# Patient Record
Sex: Male | Born: 1945 | Race: White | Hispanic: No | Marital: Married | Smoking: Former smoker
Health system: Southern US, Community
[De-identification: ages and names within clinical notes are randomized; demographics above are authoritative.]

## PROBLEM LIST (undated history)

## (undated) DIAGNOSIS — E785 Hyperlipidemia, unspecified: Secondary | ICD-10-CM

## (undated) DIAGNOSIS — H269 Unspecified cataract: Secondary | ICD-10-CM

## (undated) DIAGNOSIS — I10 Essential (primary) hypertension: Secondary | ICD-10-CM

## (undated) DIAGNOSIS — M1990 Unspecified osteoarthritis, unspecified site: Secondary | ICD-10-CM

## (undated) DIAGNOSIS — M109 Gout, unspecified: Secondary | ICD-10-CM

## (undated) DIAGNOSIS — T7840XA Allergy, unspecified, initial encounter: Secondary | ICD-10-CM

## (undated) HISTORY — PX: CATARACT EXTRACTION: SUR2

## (undated) HISTORY — PX: POLYPECTOMY: SHX149

## (undated) HISTORY — DX: Unspecified osteoarthritis, unspecified site: M19.90

## (undated) HISTORY — DX: Allergy, unspecified, initial encounter: T78.40XA

## (undated) HISTORY — DX: Gout, unspecified: M10.9

## (undated) HISTORY — DX: Unspecified cataract: H26.9

## (undated) HISTORY — PX: ROTATOR CUFF REPAIR: SHX139

## (undated) HISTORY — DX: Essential (primary) hypertension: I10

## (undated) HISTORY — PX: COLONOSCOPY: SHX174

## (undated) HISTORY — DX: Hyperlipidemia, unspecified: E78.5

---

## 1984-03-17 HISTORY — PX: APPENDECTOMY: SHX54

## 2001-01-20 DIAGNOSIS — K648 Other hemorrhoids: Secondary | ICD-10-CM | POA: Insufficient documentation

## 2001-01-20 DIAGNOSIS — K5730 Diverticulosis of large intestine without perforation or abscess without bleeding: Secondary | ICD-10-CM | POA: Insufficient documentation

## 2001-01-20 DIAGNOSIS — D126 Benign neoplasm of colon, unspecified: Secondary | ICD-10-CM | POA: Insufficient documentation

## 2002-04-05 ENCOUNTER — Ambulatory Visit (HOSPITAL_COMMUNITY): Admission: RE | Admit: 2002-04-05 | Discharge: 2002-04-05 | Payer: Self-pay | Admitting: Internal Medicine

## 2004-02-06 ENCOUNTER — Ambulatory Visit: Payer: Self-pay | Admitting: Internal Medicine

## 2004-02-27 ENCOUNTER — Ambulatory Visit: Payer: Self-pay | Admitting: Internal Medicine

## 2004-06-14 ENCOUNTER — Encounter: Admission: RE | Admit: 2004-06-14 | Discharge: 2004-06-14 | Payer: Self-pay | Admitting: Internal Medicine

## 2007-02-22 ENCOUNTER — Ambulatory Visit: Payer: Self-pay | Admitting: Internal Medicine

## 2007-02-23 ENCOUNTER — Ambulatory Visit: Payer: Self-pay | Admitting: Internal Medicine

## 2007-03-17 DIAGNOSIS — I10 Essential (primary) hypertension: Secondary | ICD-10-CM | POA: Insufficient documentation

## 2007-03-17 DIAGNOSIS — K921 Melena: Secondary | ICD-10-CM | POA: Insufficient documentation

## 2010-07-30 NOTE — Assessment & Plan Note (Signed)
[facility]                         GASTROENTEROLOGY OFFICE NOTE   BULMARO, FEAGANS                         MRN:          [id number]  DATE:02/22/2007                            DOB:          [birth year]    REASON FOR CONSULTATION:  Hemoccult positive stool and change in bowel  habits.   HISTORY:  This is a 65 year old white male with a history of adenomatous  colon polyps.  He is referred through the courtesy of Dr. Waynard Edwards  regarding Hemoccult positive stool and change in bowel habits.  Patient  reports undergoing his routine annual evaluation with Dr. Waynard Edwards last  month.  At that time his stool Hemosure was positive.  The month  previous he noted change in caliber of his stools to more thin as well  as loose stools.  Subsequently, some constipation and intermittent  abdominal pain.  He is happy to report that over the past few days his  symptoms have resolved and bowel habits are back to normal.  No gross  bleeding at any time.  His weight has been stable.  Hemoglobin was  normal.  His initial colonoscopy in 2002 revealed adenomatous colon  polyps.  His last examination was in December of 2005.  He was found to  have diverticulosis.  No polyps.  Followup in 5 years recommended.  Patient does have strong family history of colon cancer.  His mother was  diagnosed with colon cancer at age 46, also two maternal uncles.  He is  quite concerned over his recent symptoms and Hemoccult positive stool.   PAST MEDICAL HISTORY:  Hypertension.   PAST SURGICAL HISTORY:  Appendectomy.   ALLERGIES:  NO KNOWN DRUG ALLERGIES.   CURRENT MEDICATIONS:  1. Avalide 150/12.5 mg daily.  2. Lexapro 5 mg daily.  3. Aspirin 81 mg daily.  4. Pravastatin 20 mg daily.  5. Fish oil.  6. Saw palmetto.  7. Vitamin E.  8. Multivitamin.  9. Colchicine p.r.n. gout.   FAMILY HISTORY:  Mother with colon cancer at age 60.  Two maternal  uncles with colon cancer as well.   Father with heart disease.   SOCIAL HISTORY:  Patient is married with 2 sons, he lives with his wife.  He has a BS degree and worked previously in [employer],  although is currently not working.  He does not smoke, he does use  alcohol.   REVIEW OF SYSTEMS:  Per diagnostic evaluation form.   PHYSICAL EXAMINATION:  Well-appearing male in no acute distress.  Blood  pressure is 148/84, heart rate is 80, weight is 220 pounds, he is 5 feet  8 inches in height.  HEENT:  Sclerae are anicteric, conjunctivae are pink, oral mucosa is  intact, no adenopathy.  LUNGS:  Clear.  HEART:  Regular.  ABDOMEN:  Soft without tenderness, mass or hernia.  Good bowel sounds  heard.   IMPRESSION:  1. Transient changes in bowel habits and stool caliber, now resolved.      Clinical significance uncertain though doubt anything worrisome.  2. Hemoccult positive stool without anemia.  3.  History of adenomatous colon polyps.  4. Strong family history of colon cancer.   RECOMMENDATIONS:  Colonoscopy to evaluate Hemoccult positive stool and  change in bowel habits.  Ashby Dawes of the procedure as well as the risks,  benefits and alternatives have been reviewed.  He understood and agreed  to proceed.     Wilhemina Bonito. Marina Goodell, MD  Electronically Signed    JNP/MedQ  DD: 02/22/2007  DT: 02/22/2007  Job #: [id number]   cc:   Loraine Leriche A. Perini, M.D.

## 2012-01-26 ENCOUNTER — Encounter: Payer: Self-pay | Admitting: Internal Medicine

## 2012-06-14 ENCOUNTER — Encounter: Payer: Self-pay | Admitting: Internal Medicine

## 2012-06-24 ENCOUNTER — Encounter: Payer: Self-pay | Admitting: Internal Medicine

## 2012-07-21 ENCOUNTER — Ambulatory Visit (AMBULATORY_SURGERY_CENTER): Payer: Managed Care, Other (non HMO) | Admitting: *Deleted

## 2012-07-21 VITALS — Ht 70.0 in | Wt 217.8 lb

## 2012-07-21 DIAGNOSIS — Z1211 Encounter for screening for malignant neoplasm of colon: Secondary | ICD-10-CM

## 2012-07-21 MED ORDER — MOVIPREP 100 G PO SOLR: ORAL | Status: DC

## 2012-07-22 ENCOUNTER — Encounter: Payer: Self-pay | Admitting: Internal Medicine

## 2012-08-04 ENCOUNTER — Encounter: Payer: Managed Care, Other (non HMO) | Admitting: Internal Medicine

## 2012-09-23 ENCOUNTER — Encounter: Payer: Self-pay | Admitting: Internal Medicine

## 2012-09-23 ENCOUNTER — Ambulatory Visit (AMBULATORY_SURGERY_CENTER): Payer: Medicare Other | Admitting: Internal Medicine

## 2012-09-23 VITALS — BP 147/81 | HR 59 | Temp 97.1°F | Resp 20 | Ht 70.0 in | Wt 217.0 lb

## 2012-09-23 DIAGNOSIS — D126 Benign neoplasm of colon, unspecified: Secondary | ICD-10-CM

## 2012-09-23 DIAGNOSIS — Z800 Family history of malignant neoplasm of digestive organs: Secondary | ICD-10-CM

## 2012-09-23 DIAGNOSIS — K5730 Diverticulosis of large intestine without perforation or abscess without bleeding: Secondary | ICD-10-CM

## 2012-09-23 DIAGNOSIS — Z86010 Personal history of colonic polyps: Secondary | ICD-10-CM

## 2012-09-23 MED ORDER — SODIUM CHLORIDE 0.9 % IV SOLN: 500.0000 mL | INTRAVENOUS | Status: DC

## 2012-09-23 NOTE — Op Note (Signed)
[facility] [address] [place], [address]   COLONOSCOPY PROCEDURE REPORT  PATIENT: Randy, Moreno  MR#: [id number] BIRTHDATE: 31-Jan-1946 , 66  yrs. old GENDER: Male ENDOSCOPIST: Roxy Cedar, MD REFERRED XB:[id number] Program Recall PROCEDURE DATE:  09/23/2012 PROCEDURE:   Colonoscopy with snare polypectomy x 1 First Screening Colonoscopy - Avg.  risk and is 50 yrs.  old or older - No.  Prior Negative Screening - Now for repeat screening. N/A  History of Adenoma - Now for follow-up colonoscopy & has been > or = to 3 yrs.  Yes hx of adenoma.  Has been 3 or more years since last colonoscopy.  Polyps Removed Today? Yes. ASA CLASS:   Class II INDICATIONS:Patient's personal history of adenomatous colon polyps (2002 TA; f/u 2005 and 2008 were negative) and Patient's immediate family history of colon cancer (Parent 42; and 2 maternal uncles).  MEDICATIONS: MAC sedation, administered by CRNA and propofol (Diprivan) 400mg  IV  DESCRIPTION OF PROCEDURE:   After the risks benefits and alternatives of the procedure were thoroughly explained, informed consent was obtained.  A digital rectal exam revealed no abnormalities of the rectum.   The LB [id number]-[id number]  endoscope was introduced through the anus and advanced to the cecum, which was identified by both the appendix and ileocecal valve. No adverse events experienced.   The quality of the prep was good, using MoviPrep  The instrument was then slowly withdrawn as the colon was fully examined.   COLON FINDINGS: A diminutive polyp was found in the sigmoid colon. A polypectomy was performed with a cold snare.  The resection was complete and the polyp tissue was completely retrieved.   Moderate diverticulosis was noted The finding was in the left colon.   The colon mucosa was otherwise normal.  Retroflexed views revealed internal hemorrhoids. The time to cecum=3 minutes 05 seconds. Withdrawal time=12 minutes 23  seconds.  The scope was withdrawn and the procedure completed. COMPLICATIONS: There were no complications.  ENDOSCOPIC IMPRESSION: 1.   Diminutive polyp was found in the sigmoid colon; polypectomy was performed with a cold snare 2.   Moderate diverticulosis was noted in the left colon 3.   The colon mucosa was otherwise normal  RECOMMENDATIONS: 1. Follow up colonoscopy in 5 years   eSigned:  Roxy Cedar, MD 09/23/2012 12:26 PM   cc: Rodrigo Ran, MD and The Patient   PATIENT NAME:  Randy, Moreno MR#: [id number]

## 2012-09-23 NOTE — Progress Notes (Signed)
Procedure ends, to recovery, report given and VSS. 

## 2012-09-23 NOTE — Progress Notes (Signed)
Patient did not experience any of the following events: a burn prior to discharge; a fall within the facility; wrong site/side/patient/procedure/implant event; or a hospital transfer or hospital admission upon discharge from the facility. (G8907) Patient did not have preoperative order for IV antibiotic SSI prophylaxis. (G8918)  

## 2012-09-23 NOTE — Patient Instructions (Addendum)

## 2012-09-23 NOTE — Progress Notes (Signed)
Patient treated for bee sting Monday, 09/20/12. Right hand still edematous. Patient given prednisone injection. Poison oak rash left lower leg near ankle.

## 2012-09-23 NOTE — Progress Notes (Signed)
Called to room to assist during endoscopic procedure.  Patient ID and intended procedure confirmed with present staff. Received instructions for my participation in the procedure from the performing physician. ewm 

## 2012-09-24 ENCOUNTER — Telehealth: Payer: Self-pay | Admitting: *Deleted

## 2012-09-24 NOTE — Telephone Encounter (Signed)
  Follow up Call-  Call back number 09/23/2012  Post procedure Call Back phone  # [phone]  Permission to leave phone message Yes     Patient questions:  Do you have a fever, pain , or abdominal swelling? no Pain Score  0 *  Have you tolerated food without any problems? yes  Have you been able to return to your normal activities? yes  Do you have any questions about your discharge instructions: Diet   no Medications  no Follow up visit  no  Do you have questions or concerns about your Care? no  Actions: * If pain score is 4 or above: No action needed, pain <4.

## 2012-09-28 ENCOUNTER — Encounter: Payer: Self-pay | Admitting: Internal Medicine

## 2013-01-17 ENCOUNTER — Ambulatory Visit (INDEPENDENT_AMBULATORY_CARE_PROVIDER_SITE_OTHER): Payer: Medicare Other | Admitting: Neurology

## 2013-01-17 ENCOUNTER — Encounter: Payer: Self-pay | Admitting: Neurology

## 2013-01-17 VITALS — BP 159/80 | HR 75 | Ht 68.5 in | Wt 214.5 lb

## 2013-01-17 DIAGNOSIS — G629 Polyneuropathy, unspecified: Secondary | ICD-10-CM | POA: Insufficient documentation

## 2013-01-17 DIAGNOSIS — G609 Hereditary and idiopathic neuropathy, unspecified: Secondary | ICD-10-CM

## 2013-01-17 MED ORDER — GABAPENTIN ENACARBIL ER 600 MG PO TBCR: 600.0000 mg | EXTENDED_RELEASE_TABLET | Freq: Every day | ORAL | Status: DC

## 2013-01-17 NOTE — Progress Notes (Signed)
[facility]    Provider:  Dr Hosie Poisson Referring Provider: Ezequiel Kayser, MD Primary Care Physician:  Ezequiel Kayser, MD  CC:  Neuropathic pain  HPI:  Randy Moreno is a 67 y.o. male here as a referral from Dr. Waynard Edwards for neuropathic pain  Started in April, started in soles of feet and worked its way up to the knees. Burning, tingling pain, continous, can fluctuate, worse with sitting down. Doesn't keep him awake at night. No urge to move, no restless sensation. Has some unsteady sensation when walking, no difficulty with proprioception or temperature. No UE symptoms. No DM. No family history of neurological problems. Tried Lyrica 50mg  1-2 times per day, did little to no good. Tried gabapentin, made him very groggy, unsure if it made him feel any better. Denies any DM. Had extensive lab workup with Dr Waynard Edwards, was unremarkable.   Reviewed notes, labs and imaging from outside physicians, which showed unremarkable lab workup for causes of peripheral neuropathy.  Review of Systems: Out of a complete 14 system review, the patient complains of only the following symptoms, and all other reviewed systems are negative. Positive for paresthesias and pain in lower extremities  History   Social History  . Marital Status: Married    Spouse Name: Alice     Number of Children: 2  . Years of Education: BS   Occupational History  . Retired    Social History Main Topics  . Smoking status: Former Smoker    Quit date: 07/22/1987  . Smokeless tobacco: Never Used  . Alcohol Use: 16.8 oz/week    28 Glasses of wine per week     Comment: daily  . Drug Use: No  . Sexual Activity: Not on file   Other Topics Concern  . Not on file   Social History Narrative   Patient lives with wife [place].    patient has 2 children.    Patient has his BS   Patient drinks 2-4 glasses wife dialy.    Drinks caffeine daily.           Family History  Problem Relation Age of Onset  . Colon  cancer Mother 6  . Heart disease Father   . Diabetes Father   . Heart Problems Paternal Grandfather     Past Medical History  Diagnosis Date  . Hypertension   . Gout   . Hyperlipidemia   . Allergy     Past Surgical History  Procedure Laterality Date  . Appendectomy  1986  . Cataract extraction      Current Outpatient Prescriptions  Medication Sig Dispense Refill  . amLODipine (NORVASC) 5 MG tablet Take 5 mg by mouth daily.      Marland Kitchen aspirin 81 MG tablet Take 81 mg by mouth daily.      . cholecalciferol (VITAMIN D) 1000 UNITS tablet Take 1,000 Units by mouth daily.      . colchicine 0.6 MG tablet Take 0.6 mg by mouth 2 (two) times daily.      . Cyanocobalamin (VITAMIN B 12 PO) Take by mouth daily.      Marland Kitchen escitalopram (LEXAPRO) 10 MG tablet Take 10 mg by mouth daily.      . fish oil-omega-3 fatty acids 1000 MG capsule Take 2 g by mouth daily.      Marland Kitchen losartan-hydrochlorothiazide (HYZAAR) 100-12.5 MG per tablet Take 1 tablet by mouth daily.      . Multiple Vitamin (MULTIVITAMIN) tablet Take 1 tablet by mouth  daily.      . Testosterone (ANDROGEL) 40.5 MG/2.5GM (1.62%) GEL Place onto the skin daily.      Marland Kitchen atorvastatin (LIPITOR) 20 MG tablet Take 20 mg by mouth daily.       No current facility-administered medications for this visit.    Allergies as of 01/17/2013  . (No Known Allergies)    Vitals: BP 159/80  Pulse 75  Ht 5' 8.5" (1.74 m)  Wt 214 lb 8 oz (97.297 kg)  BMI 32.14 kg/m2 Last Weight:  Wt Readings from Last 1 Encounters:  01/17/13 214 lb 8 oz (97.297 kg)   Last Height:   Ht Readings from Last 1 Encounters:  01/17/13 5' 8.5" (1.74 m)     Physical exam: Exam: Gen: NAD, conversant Eyes: anicteric sclerae, moist conjunctivae HENT: Atraumatic, oropharynx clear Neck: Trachea midline; supple,  Lungs: CTA, no wheezing, rales, rhonic                          CV: RRR, no MRG Abdomen: Soft, non-tender;  Extremities: No peripheral edema  Skin: Normal  temperature, no rash,  Psych: Appropriate affect, pleasant  Neuro: MS: AA&Ox3, appropriately interactive, normal affect   Speech: fluent w/o paraphasic error  Memory: good recent and remote recall  CN: PERRL, EOMI no nystagmus, no ptosis, sensation intact to LT V1-V3 bilat, face symmetric, no weakness, hearing grossly intact, palate elevates symmetrically, shoulder shrug 5/5 bilat,  tongue protrudes midline, no fasiculations noted.  Motor: normal bulk and tone Strength: 5/5  In all extremities  Coord: rapid alternating and point-to-point (FNF, HTS) movements intact.  Reflexes: symmetrical, bilat downgoing toes  Sens: LT intact in all extremities, diminished vibration and PP to mid-shin, intact proprioception  Gait: posture, stance, stride and arm-swing normal. Tandem gait intact. Able to walk on heels and toes. Romberg absent.   Assessment:  After physical and neurologic examination, review of laboratory studies, imaging, neurophysiology testing and pre-existing records, assessment will be reviewed on the problem list.  Plan:  Treatment plan and additional workup will be reviewed under Problem List.  1)Idiopathic peripheral neuropathy  67y/o gentleman with history and physical exam consistent with an idiopathic peripheral neuropathy. Has tried Lyrica and gabapentin without symptomatic relief. Will check EMG nerve conduction study. Will try patient on Horizant 600mg  nightly. Patient provided with 1 months free samples to trial medication. If no symptomatic benefit would consider SNRI or alternative AED. Follow up in 4 to 6 months.

## 2013-01-17 NOTE — Patient Instructions (Signed)
Overall you are doing fairly well but I do want to suggest a few things today:   As far as your medications are concerned, I would like to suggest trying a medication called Horizant. It is a different form of Gabapentin that you may tolerate better. Try it for 4 weeks and see if you notice any benefit.   As far as diagnostic testing: I would like to get an EMG/NCS.   I would like to see you back in 4 to 6 months, sooner if we need to. Please call us with any interim questions, concerns, problems, updates or refill requests.   Please also call us for any test results so we can go over those with you on the phone.  My clinical assistant and will answer any of your questions and relay your messages to me and also relay most of my messages to you.   Our phone number is [phone]. We also have an after hours call service for urgent matters and there is a physician on-call for urgent questions. For any emergencies you know to call 911 or go to the nearest emergency room

## 2013-01-21 ENCOUNTER — Encounter (INDEPENDENT_AMBULATORY_CARE_PROVIDER_SITE_OTHER): Payer: Self-pay

## 2013-01-21 ENCOUNTER — Ambulatory Visit (INDEPENDENT_AMBULATORY_CARE_PROVIDER_SITE_OTHER): Payer: Medicare Other | Admitting: Neurology

## 2013-01-21 DIAGNOSIS — G609 Hereditary and idiopathic neuropathy, unspecified: Secondary | ICD-10-CM

## 2013-01-21 DIAGNOSIS — Z0289 Encounter for other administrative examinations: Secondary | ICD-10-CM

## 2013-01-21 DIAGNOSIS — R209 Unspecified disturbances of skin sensation: Secondary | ICD-10-CM

## 2013-01-21 NOTE — Procedures (Signed)
  HISTORY:  Randy Moreno is a 67 year old gentleman with a history of numbness and tingling sensations in the feet and legs that began in April 2014. This gradually worsened over the next several months, but more recently, the symptoms have improved slightly. The patient denies any neck or low back discomfort or pain radiating down the arms or legs. The patient is being evaluated for a possible peripheral neuropathy.  NERVE CONDUCTION STUDIES:  Nerve conduction studies were performed on both lower extremities. The distal motor latencies and motor amplitudes for the peroneal and posterior tibial nerves were within normal limits. The nerve conduction velocities for these nerves were also normal. The H reflex latencies were normal. The sensory latencies for the peroneal nerves were within normal limits.   EMG STUDIES:  EMG study was performed on the right lower extremity:  The tibialis anterior muscle reveals 2 to 4K motor units with full recruitment. No fibrillations or positive waves were seen. The peroneus tertius muscle reveals 2 to 4K motor units with full recruitment. No fibrillations or positive waves were seen. The medial gastrocnemius muscle reveals 1 to 3K motor units with full recruitment. No fibrillations or positive waves were seen. The vastus lateralis muscle reveals 2 to 4K motor units with full recruitment. No fibrillations or positive waves were seen. The iliopsoas muscle reveals 2 to 4K motor units with full recruitment. No fibrillations or positive waves were seen. The biceps femoris muscle (long head) reveals 2 to 4K motor units with full recruitment. No fibrillations or positive waves were seen. The lumbosacral paraspinal muscles were tested at 3 levels, and revealed no abnormalities of insertional activity at all 3 levels tested. There was good relaxation.  EMG study was performed on the left lower extremity:  The tibialis anterior muscle reveals 2 to 4K motor units with full  recruitment. No fibrillations or positive waves were seen. The peroneus tertius muscle reveals 2 to 4K motor units with full recruitment. No fibrillations or positive waves were seen. The medial gastrocnemius muscle reveals 1 to 3K motor units with full recruitment. No fibrillations or positive waves were seen. The vastus lateralis muscle reveals 2 to 4K motor units with full recruitment. No fibrillations or positive waves were seen. The iliopsoas muscle reveals 2 to 4K motor units with full recruitment. No fibrillations or positive waves were seen. The biceps femoris muscle (long head) reveals 2 to 4K motor units with full recruitment. No fibrillations or positive waves were seen. The lumbosacral paraspinal muscles were tested at 3 levels, and revealed no abnormalities of insertional activity at all 3 levels tested. There was good relaxation.   IMPRESSION:  Nerve conduction studies done on both lower extremities were unremarkable, without evidence of a peripheral neuropathy. A small fiber neuropathy may be missed by standard nerve conduction studies, however. Clinical correlation is required. EMG evaluation of both lower extremities was unremarkable, no evidence of a lumbosacral radiculopathy was seen on either side.  Marlan Palau MD 01/21/2013 4:46 PM  [facility] [address] [place], [place] [address]  Phone [phone] Fax [phone]

## 2013-01-24 NOTE — Progress Notes (Signed)
Quick Note:  Left message that EMG/NCS was normal and to continue on trial of Horizant, per Dr. Hosie Poisson. Told to call with any questions. ______

## 2015-01-22 ENCOUNTER — Emergency Department (HOSPITAL_COMMUNITY)
Admission: EM | Admit: 2015-01-22 | Discharge: 2015-01-22 | Disposition: A | Discharge status: Home / Self Care | Payer: Medicare Other | Attending: Emergency Medicine | Admitting: Emergency Medicine

## 2015-01-22 ENCOUNTER — Encounter (HOSPITAL_COMMUNITY): Payer: Self-pay | Admitting: Emergency Medicine

## 2015-01-22 ENCOUNTER — Emergency Department (HOSPITAL_COMMUNITY): Payer: Medicare Other

## 2015-01-22 DIAGNOSIS — E785 Hyperlipidemia, unspecified: Secondary | ICD-10-CM | POA: Diagnosis not present

## 2015-01-22 DIAGNOSIS — T1490XA Injury, unspecified, initial encounter: Secondary | ICD-10-CM

## 2015-01-22 DIAGNOSIS — Y9389 Activity, other specified: Secondary | ICD-10-CM | POA: Diagnosis not present

## 2015-01-22 DIAGNOSIS — W540XXA Bitten by dog, initial encounter: Secondary | ICD-10-CM | POA: Insufficient documentation

## 2015-01-22 DIAGNOSIS — Z23 Encounter for immunization: Secondary | ICD-10-CM | POA: Insufficient documentation

## 2015-01-22 DIAGNOSIS — S61216A Laceration without foreign body of right little finger without damage to nail, initial encounter: Secondary | ICD-10-CM | POA: Insufficient documentation

## 2015-01-22 DIAGNOSIS — Y9289 Other specified places as the place of occurrence of the external cause: Secondary | ICD-10-CM | POA: Insufficient documentation

## 2015-01-22 DIAGNOSIS — S61452A Open bite of left hand, initial encounter: Secondary | ICD-10-CM | POA: Diagnosis present

## 2015-01-22 DIAGNOSIS — Z87891 Personal history of nicotine dependence: Secondary | ICD-10-CM | POA: Diagnosis not present

## 2015-01-22 DIAGNOSIS — Z79899 Other long term (current) drug therapy: Secondary | ICD-10-CM | POA: Diagnosis not present

## 2015-01-22 DIAGNOSIS — Y998 Other external cause status: Secondary | ICD-10-CM | POA: Diagnosis not present

## 2015-01-22 DIAGNOSIS — Z7982 Long term (current) use of aspirin: Secondary | ICD-10-CM | POA: Diagnosis not present

## 2015-01-22 DIAGNOSIS — S61256A Open bite of right little finger without damage to nail, initial encounter: Secondary | ICD-10-CM | POA: Diagnosis not present

## 2015-01-22 DIAGNOSIS — I10 Essential (primary) hypertension: Secondary | ICD-10-CM | POA: Insufficient documentation

## 2015-01-22 DIAGNOSIS — M109 Gout, unspecified: Secondary | ICD-10-CM | POA: Insufficient documentation

## 2015-01-22 DIAGNOSIS — S61259A Open bite of unspecified finger without damage to nail, initial encounter: Secondary | ICD-10-CM

## 2015-01-22 MED ORDER — AMOXICILLIN-POT CLAVULANATE 875-125 MG PO TABS: 1.0000 | ORAL_TABLET | Freq: Two times a day (BID) | ORAL | Status: DC

## 2015-01-22 MED ORDER — TETANUS-DIPHTH-ACELL PERTUSSIS 5-2.5-18.5 LF-MCG/0.5 IM SUSP
0.5000 mL | Freq: Once | INTRAMUSCULAR | Status: AC
  Administered 2015-01-22: 0.5 mL via INTRAMUSCULAR
  Filled 2015-01-22: qty 0.5

## 2015-01-22 MED ORDER — HYDROCODONE-ACETAMINOPHEN 5-325 MG PO TABS: 2.0000 | ORAL_TABLET | ORAL | Status: DC | PRN

## 2015-01-22 NOTE — Discharge Instructions (Signed)
Animal Bite Animal bites can range from mild to serious. An animal bite can result in a scratch on the skin, a deep open cut, a puncture of the skin, a crush injury, or tearing away of the skin or a body part. A small bite from a house pet will usually not cause serious problems. However, some animal bites can become infected or injure a bone or other tissue.  Bites from certain animals can be more dangerous because of the risk of spreading rabies, which is a serious viral infection. This risk is higher with bites from stray animals or wild animals, such as raccoons, foxes, skunks, and bats. Dogs are responsible for most animal bites. Children are bitten more often than adults. SYMPTOMS  Common symptoms of an animal bite include:   Pain.   Bleeding.   Swelling.   Bruising.  DIAGNOSIS  This condition may be diagnosed based on a physical exam and medical history. Your health care provider will examine the wound and ask for details about the animal and how the bite happened. You may also have tests, such as:   Blood tests to check for infection or to determine if surgery is needed.  X-rays to check for damage to bones or joints.  Culture test. This uses a sample of fluid from the wound to check for infection. TREATMENT  Treatment varies depending on the location and type of animal bite and your medical history. Treatment may include:   Wound care. This often includes cleaning the wound, flushing the wound with saline solution, and applying a bandage (dressing). Sometimes, the wound is left open to heal because of the high risk of infection. However, in some cases, the wound may be closed with stitches (sutures), staples, skin glue, or adhesive strips.   Antibiotic medicine.   Tetanus shot.   Rabies treatment if the animal could have rabies.  In some cases, bites that have become infected may require IV antibiotics and surgical treatment in the hospital.  [place]  Follow instructions from your health care provider about how to take care of your wound. Make sure you:  Wash your hands with soap and water before you change your dressing. If soap and water are not available, use hand sanitizer.  Change your dressing as told by your health care provider.  Leave sutures, skin glue, or adhesive strips in place. These skin closures may need to be in place for 2 weeks or longer. If adhesive strip edges start to loosen and curl up, you may trim the loose edges. Do not remove adhesive strips completely unless your health care provider tells you to do that.  Check your wound every day for signs of infection. Watch for:   Increasing redness, swelling, or pain.   Fluid, blood, or pus.  General Instructions  Take or apply over-the-counter and prescription medicines only as told by your health care provider.   If you were prescribed an antibiotic, take or apply it as told by your health care provider. Do not stop using the antibiotic even if your condition improves.   Keep the injured area raised (elevated) above the level of your heart while you are sitting or lying down, if this is possible.   If directed, apply ice to the injured area.   Put ice in a plastic bag.   Place a towel between your skin and the bag.   Leave the ice on for 20 minutes, 2-3 times per day.  Keep all follow-up visits as told by your health care provider. This is important.  SEEK MEDICAL CARE IF:  You have increasing redness, swelling, or pain at the site of your wound.   You have a general feeling of sickness (malaise).   You feel nauseous or you vomit.   You have pain that does not get better.  SEEK IMMEDIATE MEDICAL CARE IF:  You have a red streak extending away from your wound.   You have fluid, blood, or pus coming from your wound.   You have a fever or chills.   You have trouble moving your injured area.   You  have numbness or tingling extending beyond the wound.   This information is not intended to replace advice given to you by your health care provider. Make sure you discuss any questions you have with your health care provider.   Document Released: 11/19/2010 Document Revised: 11/22/2014 Document Reviewed: 07/19/2014 Elsevier Interactive Patient Education 2016 [place] Taking care of your wound properly can help to prevent pain and infection. It can also help your wound to heal more quickly.  HOW TO CARE FOR YOUR WOUND  Take or apply over-the-counter and prescription medicines only as told by your health care provider.  If you were prescribed antibiotic medicine, take or apply it as told by your health care provider. Do not stop using the antibiotic even if your condition improves.  Clean the wound each day or as told by your health care provider.  Wash the wound with mild soap and water.  Rinse the wound with water to remove all soap.  Pat the wound dry with a clean towel. Do not rub it.  There are many different ways to close and cover a wound. For example, a wound can be covered with stitches (sutures), skin glue, or adhesive strips. Follow instructions from your health care provider about:  How to take care of your wound.  When and how you should change your bandage (dressing).  When you should remove your dressing.  Removing whatever was used to close your wound.  Check your wound every day for signs of infection. Watch for:  Redness, swelling, or pain.  Fluid, blood, or pus.  Keep the dressing dry until your health care provider says it can be removed. Do not take baths, swim, use a hot tub, or do anything that would put your wound underwater until your health care provider approves.  Raise (elevate) the injured area above the level of your heart while you are sitting or lying down.  Do not scratch or pick at the wound.  Keep all follow-up visits  as told by your health care provider. This is important. SEEK MEDICAL CARE IF:  You received a tetanus shot and you have swelling, severe pain, redness, or bleeding at the injection site.  You have a fever.  Your pain is not controlled with medicine.  You have increased redness, swelling, or pain at the site of your wound.  You have fluid, blood, or pus coming from your wound.  You notice a bad smell coming from your wound or your dressing. SEEK IMMEDIATE MEDICAL CARE IF:  You have a red streak going away from your wound.   This information is not intended to replace advice given to you by your health care provider. Make sure you discuss any questions you have with your health care provider.   Document Released: 12/11/2007 Document Revised: 07/18/2014 Document Reviewed: 02/27/2014 Elsevier Interactive  Patient Education 2016 [place].  Mechanical Wound Debridement, Care After Refer to this sheet in the next few weeks. These instructions provide you with information about caring for yourself after your procedure. Your health care provider may also give you more specific instructions. Your treatment has been planned according to current medical practices, but problems sometimes occur. Call your health care provider if you have any problems or questions after your procedure.  WHAT TO EXPECT AFTER THE PROCEDURE: After your procedure, it is common to have:  Soreness or pain.  Light bleeding.  Tightness.  Skin irritation. HOME CARE INSTRUCTIONS Medicines  Take over-the-counter and prescription medicines only as told by your health care provider.  If you were prescribed an antibiotic medicine, take it or apply it as told by your health care provider. Do not stop taking or using the antibiotic even if your condition improves. Wound Care  Follow instructions from your health care provider about:  How to take care of your wound.  When and how you should change your dressing.  If your dressing is dry and stuck when you try to remove it, moisten or wet the dressing with saline or water so that it can be removed without harming your skin or wound.  When you should remove your dressing.  Check your wound every day for signs of infection. Watch for:  More redness or swelling.  More fluid or pus.  A bad smell.  More pain, bleeding, or warmth around the wound. General Instructions  Eat a healthy diet with lots of protein. Ask your health care provider to suggest the best diet for you.  Do not smoke. Smoking makes it harder for your body to heal.  Keep all follow-up visits as told by your health care provider. This is important.  Do not take baths, swim, or use a hot tub until your health care provider approves.   Ask your health care provider what activities are safe for you.  SEEK MEDICAL CARE IF:  You have a fever.  Your pain medicine is not helping.   Your wound is red and swollen.   You have more bleeding or fluid coming from the wound.  You have pus coming from your wound after cleaning it.  You have a bad smell coming from your wound after cleaning it.  Your wound is not getting better within 1-2 weeks of treatment.  You develop a new medical condition, such as diabetes, peripheral vascular disease, or a condition that affects your defense (immune) system.   This information is not intended to replace advice given to you by your health care provider. Make sure you discuss any questions you have with your health care provider.   Follow up with your Primary Care in 24-48 hours for re-evaluation of wound. Return to the [facility] if you experience redness, swelling, or excessive drainage from affected area or if you experience a fever, chills, muscle rigidity. Take antibiotics as prescribed.

## 2015-01-22 NOTE — ED Notes (Signed)
Patient was educated not to drive, operate heavy machinery, or drink alcohol while taking narcotic medication.  

## 2015-01-22 NOTE — ED Notes (Signed)
Per pt, states playing with his puppy and he accidentally bit him-laceration to right little finger

## 2015-01-22 NOTE — ED Provider Notes (Signed)
CSN: [id number]     Arrival date & time 01/22/15  1016 History   First MD Initiated Contact with Patient 01/22/15 1026     Chief Complaint  Patient presents with  . Animal Bite     (Consider location/radiation/quality/duration/timing/severity/associated sxs/prior Treatment) HPI   Randy Moreno is a 69 year old male who presents the emergency department complaining of dog bite to left hand. Patient states that he was playing with his 77-month-old Restaurant manager, fast food puppy last night when he accidentally bit his right pinky. This occurred 14 hours prior to arrival. The dog is known to the patient and has had his rabies vaccinations. Denies redness or swelling of his finger, drainage from the affected area. Denies fever, muscle rigidity. Patient is able to flex and extend his pinky without difficulty. No other trauma or injury. Pt is unsure of his last tetanus shot.  Past Medical History  Diagnosis Date  . Hypertension   . Gout   . Hyperlipidemia   . Allergy    Past Surgical History  Procedure Laterality Date  . Appendectomy  1986  . Cataract extraction     Family History  Problem Relation Age of Onset  . Colon cancer Mother 13  . Heart disease Father   . Diabetes Father   . Heart Problems Paternal Grandfather    Social History  Substance Use Topics  . Smoking status: Former Smoker    Quit date: 07/22/1987  . Smokeless tobacco: Never Used  . Alcohol Use: 16.8 oz/week    28 Glasses of wine per week     Comment: daily    Review of Systems  All other systems reviewed and are negative.     Allergies  Review of patient's allergies indicates no known allergies.  Home Medications   Prior to Admission medications   Medication Sig Start Date End Date Taking? Authorizing Provider  amLODipine (NORVASC) 5 MG tablet Take 5 mg by mouth daily.    Historical Provider, MD  aspirin 81 MG tablet Take 81 mg by mouth daily.    Historical Provider, MD  atorvastatin (LIPITOR) 20 MG tablet  Take 20 mg by mouth daily.    Historical Provider, MD  cholecalciferol (VITAMIN D) 1000 UNITS tablet Take 1,000 Units by mouth daily.    Historical Provider, MD  colchicine 0.6 MG tablet Take 0.6 mg by mouth 2 (two) times daily.    Historical Provider, MD  Cyanocobalamin (VITAMIN B 12 PO) Take by mouth daily.    Historical Provider, MD  escitalopram (LEXAPRO) 10 MG tablet Take 10 mg by mouth daily.    Historical Provider, MD  fish oil-omega-3 fatty acids 1000 MG capsule Take 2 g by mouth daily.    Historical Provider, MD  Gabapentin Enacarbil (HORIZANT) 600 MG TBCR Take 600 mg by mouth at bedtime. 01/17/13   Drema Dallas, DO  losartan-hydrochlorothiazide (HYZAAR) 100-12.5 MG per tablet Take 1 tablet by mouth daily.    Historical Provider, MD  Multiple Vitamin (MULTIVITAMIN) tablet Take 1 tablet by mouth daily.    Historical Provider, MD  Testosterone (ANDROGEL) 40.5 MG/2.5GM (1.62%) GEL Place onto the skin daily.    Historical Provider, MD   BP 169/72 mmHg  Pulse 85  Temp(Src) 98.2 F (36.8 C) (Oral)  Resp 16  SpO2 96% Physical Exam  Constitutional: He is oriented to person, place, and time. He appears well-developed and well-nourished.  HENT:  Head: Normocephalic and atraumatic.  Eyes: Conjunctivae are normal. Right eye exhibits no discharge. Left eye exhibits  no discharge. No scleral icterus.  Cardiovascular: Normal rate.   Pulmonary/Chest: Effort normal.  Musculoskeletal:  4 cm vertical, superficial laceration to the lateral aspect of the right fifth digit. No bony exposure. No evidence of tendon injury. No decreased range of motion. Able to flex and extend this digit without difficulty. Good cap refill. Intact distal pulses. Good grip strength.  No edema, erythema. No sign of infection.  Neurological: He is alert and oriented to person, place, and time. Coordination normal.  Strength 5/5 throughout. No sensory deficits.    Skin: Skin is warm and dry. No rash noted. No erythema. No  pallor.  Psychiatric: He has a normal mood and affect. His behavior is normal.  Nursing note and vitals reviewed.   ED Course  Procedures (including critical care time) Labs Review Labs Reviewed - No data to display  Imaging Review No results found. I have personally reviewed and evaluated these images and lab results as part of my medical decision-making.   EKG Interpretation None      MDM   Final diagnoses:  Dog bite of finger, initial encounter   otherwise healthy 70 year old male presents with dog bite to right fifth digit. Dog is known to the patient has had rabies vaccinations. Patient is unsure of his last tetanus shot. We will update today. Laceration superficial and is located to the lateral aspect of his right fifth digit. No evidence of tendon injury. No decreased range of motion of that digit. Able to flex and extend without difficulty. No bony exposure. No decreased sensation. The bite Occurred 14 hours prior to arrival. Due to duration of time, location of laceration on the hand caused by dog bite, will not suture due to increased risk of infection. We will x-ray the hand to rule out foreign body.  X-ray negative for fracture or foreign body.  Will place patient on Augmentin. Patient is to follow-up with primary care provider in 24-48 hours for reevaluation of wound. Pt expresses understanding and is in agreement. Dressing applied. Return precautions outlined in patient discharge instructions.      Dondra Spry [place], PA-C 01/22/15 1211  Dorie Rank, MD 01/23/15 [phone]

## 2016-08-01 ENCOUNTER — Other Ambulatory Visit: Payer: Self-pay | Admitting: Internal Medicine

## 2016-08-01 DIAGNOSIS — R17 Unspecified jaundice: Secondary | ICD-10-CM

## 2016-08-05 ENCOUNTER — Other Ambulatory Visit: Payer: Medicare Other

## 2016-08-20 ENCOUNTER — Ambulatory Visit
Admission: RE | Admit: 2016-08-20 | Discharge: 2016-08-20 | Disposition: A | Discharge status: Home / Self Care | Payer: Medicare Other | Source: Ambulatory Visit | Attending: Internal Medicine | Admitting: Internal Medicine

## 2016-08-20 DIAGNOSIS — R17 Unspecified jaundice: Secondary | ICD-10-CM

## 2017-03-19 ENCOUNTER — Encounter (INDEPENDENT_AMBULATORY_CARE_PROVIDER_SITE_OTHER): Payer: Self-pay | Admitting: Orthopaedic Surgery

## 2017-03-19 ENCOUNTER — Ambulatory Visit (INDEPENDENT_AMBULATORY_CARE_PROVIDER_SITE_OTHER): Payer: Medicare Other

## 2017-03-19 ENCOUNTER — Ambulatory Visit (INDEPENDENT_AMBULATORY_CARE_PROVIDER_SITE_OTHER): Payer: Medicare Other | Admitting: Orthopaedic Surgery

## 2017-03-19 DIAGNOSIS — M25511 Pain in right shoulder: Secondary | ICD-10-CM | POA: Diagnosis not present

## 2017-03-19 DIAGNOSIS — G8929 Other chronic pain: Secondary | ICD-10-CM | POA: Diagnosis not present

## 2017-03-19 DIAGNOSIS — M25512 Pain in left shoulder: Secondary | ICD-10-CM | POA: Diagnosis not present

## 2017-03-19 MED ORDER — BUPIVACAINE HCL 0.5 % IJ SOLN
3.0000 mL | INTRAMUSCULAR | Status: AC | PRN
  Administered 2017-03-19: 3 mL via INTRA_ARTICULAR

## 2017-03-19 MED ORDER — LIDOCAINE HCL 1 % IJ SOLN
3.0000 mL | INTRAMUSCULAR | Status: AC | PRN
  Administered 2017-03-19: 3 mL

## 2017-03-19 MED ORDER — METHYLPREDNISOLONE ACETATE 40 MG/ML IJ SUSP
40.0000 mg | INTRAMUSCULAR | Status: AC | PRN
  Administered 2017-03-19: 40 mg via INTRA_ARTICULAR

## 2017-03-19 NOTE — Progress Notes (Signed)
Office Visit Note   Patient: Randy Moreno           Date of Birth: 1945/09/22           MRN: [id number] Visit Date: 03/19/2017              Requested by: Crist Infante, MD [address] [place], [place] [address] PCP: Crist Infante, MD   Assessment & Plan: Visit Diagnoses:  1. Chronic right shoulder pain   2. Acute pain of left shoulder     Plan: Impression is bilateral shoulder pain possible rotator cuff syndrome worse on the right.  Subacromial injection was performed in both shoulders today.  Patient did have anesthetic relief.  Recommend physical therapy for 4-6 weeks.  If not better will need to consider advanced imaging.  Follow-Up Instructions: Return if symptoms worsen or fail to improve.   Orders:  Orders Placed This Encounter  Procedures  . XR Shoulder Left  . XR Shoulder Right   No orders of the defined types were placed in this encounter.     Procedures: Large Joint Inj: bilateral subacromial bursa on 03/19/2017 11:12 AM Indications: pain Details: 22 G needle Medications (Right): 3 mL lidocaine 1 %; 3 mL bupivacaine 0.5 %; 40 mg methylPREDNISolone acetate 40 MG/ML Medications (Left): 3 mL lidocaine 1 %; 3 mL bupivacaine 0.5 %; 40 mg methylPREDNISolone acetate 40 MG/ML Outcome: tolerated well, no immediate complications Patient was prepped and draped in the usual sterile fashion.       Clinical Data: No additional findings.   Subjective: Chief Complaint  Patient presents with  . Right Shoulder - Pain  . Left Shoulder - Pain    Patient is a patient comes in with bilateral shoulder pain that has gradually gotten worse over the past 6 months.  He denies any radicular symptoms or neck pain.  Denies any injuries other than doing yard work about 6 weeks ago that may have aggravated it.  His pain is localized to the shoulder region.  He feels that he has some decreased range of motion secondary to pain.  Denies any radiation of pain    Review of Systems    Constitutional: Negative.   All other systems reviewed and are negative.    Objective: Vital Signs: There were no vitals taken for this visit.  Physical Exam  Constitutional: He is oriented to person, place, and time. He appears well-developed and well-nourished.  HENT:  Head: Normocephalic and atraumatic.  Eyes: Pupils are equal, round, and reactive to light.  Neck: Neck supple.  Pulmonary/Chest: Effort normal.  Abdominal: Soft.  Musculoskeletal: Normal range of motion.  Neurological: He is alert and oriented to person, place, and time.  Skin: Skin is warm.  Psychiatric: He has a normal mood and affect. His behavior is normal. Judgment and thought content normal.  Nursing note and vitals reviewed.   Ortho Exam Bilateral shoulder exam shows pain with general movement of the shoulder joint.  Rotator cuff is mildly symptomatic and positive empty can sign.  Positive impingement. Specialty Comments:  No specialty comments available.  Imaging: Xr Shoulder Left  Result Date: 03/19/2017 Mild osteoarthritis left shoulder joint.  No acute  Xr Shoulder Right  Result Date: 03/19/2017 Mildly high riding humeral head with mild degenerative joint disease.  Irregularity at the greater tuberosity.    PMFS History: Patient Active Problem List   Diagnosis Date Noted  . Peripheral neuropathy 01/17/2013  . HYPERTENSION 03/17/2007  . HEMOCCULT POSITIVE STOOL 03/17/2007  .  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 HEMORRHOIDS, INTERNAL 01/20/2001  . DIVERTICULOSIS, COLON 01/20/2001   Past Medical History:  Diagnosis Date  . Allergy   . Gout   . Hyperlipidemia   . Hypertension     Family History  Problem Relation Age of Onset  . Colon cancer Mother 59  . Heart disease Father   . Diabetes Father   . Heart Problems Paternal Grandfather     Past Surgical History:  Procedure Laterality Date  . APPENDECTOMY  1986  . CATARACT EXTRACTION     Social History   Occupational  History  . Occupation: Retired  Tobacco Use  . Smoking status: Former Smoker    Last attempt to quit: 07/22/1987    Years since quitting: 29.6  . Smokeless tobacco: Never Used  Substance and Sexual Activity  . Alcohol use: Yes    Alcohol/week: 16.8 oz    Types: 28 Glasses of wine per week    Comment: daily  . Drug use: No  . Sexual activity: Not on file

## 2017-04-21 ENCOUNTER — Telehealth (INDEPENDENT_AMBULATORY_CARE_PROVIDER_SITE_OTHER): Payer: Self-pay | Admitting: Orthopaedic Surgery

## 2017-04-21 NOTE — Telephone Encounter (Signed)
Please call pt if we have a earlier appt

## 2017-04-23 ENCOUNTER — Encounter (INDEPENDENT_AMBULATORY_CARE_PROVIDER_SITE_OTHER): Payer: Self-pay | Admitting: Surgery

## 2017-04-23 ENCOUNTER — Ambulatory Visit (INDEPENDENT_AMBULATORY_CARE_PROVIDER_SITE_OTHER): Payer: Medicare Other | Admitting: Surgery

## 2017-04-23 DIAGNOSIS — M25511 Pain in right shoulder: Secondary | ICD-10-CM

## 2017-04-23 DIAGNOSIS — G8929 Other chronic pain: Secondary | ICD-10-CM

## 2017-04-23 DIAGNOSIS — M25512 Pain in left shoulder: Secondary | ICD-10-CM | POA: Diagnosis not present

## 2017-04-23 MED ORDER — TRAMADOL HCL 50 MG PO TABS: 50.0000 mg | ORAL_TABLET | Freq: Two times a day (BID) | ORAL | 0 refills | Status: DC | PRN

## 2017-04-23 NOTE — Progress Notes (Signed)
Office Visit Note   Patient: Randy Moreno           Date of Birth: 1945-11-13           MRN: [id number] Visit Date: 04/23/2017              Requested by: Crist Infante, MD [address] [place], [place] [address] PCP: Crist Infante, MD   Assessment & Plan: Visit Diagnoses:  1. Chronic right shoulder pain   2. Chronic left shoulder pain     Plan: With patient's ongoing bilateral shoulder pain and failed conservative treatment we will schedule MRI scans to rule out rotator cuff tears.  Follow-up with Dr. Lorin Mercy after completion to discuss results and further treatment options.  Right shoulder is worse than the left.  Follow-Up Instructions: Return in about 3 weeks (around 05/14/2017) for with dr yates to review mri scans.   Orders:  Orders Placed This Encounter  Procedures  . MR SHOULDER RIGHT WO CONTRAST  . MR Shoulder Left w/o contrast   Meds ordered this encounter  Medications  . traMADol (ULTRAM) 50 MG tablet    Sig: Take 1 tablet (50 mg total) by mouth every 12 (twelve) hours as needed.    Dispense:  30 tablet    Refill:  0      Procedures: No procedures performed   Clinical Data: No additional findings.   Subjective: Chief Complaint  Patient presents with  . Left Shoulder - Pain  . Right Shoulder - Pain    HPI Patient returns for recheck of bilateral shoulder pain.  Last office visit Dr. Erlinda Hong performed subacromial Marcaine/Depo-Medrol injections to both shoulders.  This gave minimal improvement.  Right shoulder pain worse than left. Review of Systems No current cardiac pulmonary GI GU issues  Objective: Vital Signs: There were no vitals taken for this visit.  Physical Exam  Constitutional: He is oriented to person, place, and time. No distress.  HENT:  Head: Normocephalic.  Eyes: EOM are normal. Pupils are equal, round, and reactive to light.  Pulmonary/Chest: No respiratory distress.  Abdominal: He exhibits no distension.  Musculoskeletal:    Shoulders he has positive right greater than left impingement testing.  Some discomfort with bilateral supraspinatus resistance.  Negative drop arm test.  Neurological: He is alert and oriented to person, place, and time.  Skin: Skin is warm and dry.  Psychiatric: He has a normal mood and affect.    Ortho Exam  Specialty Comments:  No specialty comments available.  Imaging: No results found.   PMFS History: Patient Active Problem List   Diagnosis Date Noted  . Peripheral neuropathy 01/17/2013  . HYPERTENSION 03/17/2007  . HEMOCCULT POSITIVE STOOL 03/17/2007  . 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 HEMORRHOIDS, INTERNAL 01/20/2001  . DIVERTICULOSIS, COLON 01/20/2001   Past Medical History:  Diagnosis Date  . Allergy   . Gout   . Hyperlipidemia   . Hypertension     Family History  Problem Relation Age of Onset  . Colon cancer Mother 79  . Heart disease Father   . Diabetes Father   . Heart Problems Paternal Grandfather     Past Surgical History:  Procedure Laterality Date  . APPENDECTOMY  1986  . CATARACT EXTRACTION     Social History   Occupational History  . Occupation: Retired  Tobacco Use  . Smoking status: Former Smoker    Last attempt to quit: 07/22/1987    Years since quitting: 29.7  . Smokeless tobacco:  Never Used  Substance and Sexual Activity  . Alcohol use: Yes    Alcohol/week: 16.8 oz    Types: 28 Glasses of wine per week    Comment: daily  . Drug use: No  . Sexual activity: Not on file

## 2017-04-30 ENCOUNTER — Encounter (HOSPITAL_COMMUNITY): Payer: Self-pay | Admitting: Emergency Medicine

## 2017-04-30 ENCOUNTER — Emergency Department (HOSPITAL_COMMUNITY)
Admission: EM | Admit: 2017-04-30 | Discharge: 2017-05-01 | Disposition: A | Discharge status: Home / Self Care | Payer: Medicare Other | Attending: Emergency Medicine | Admitting: Emergency Medicine

## 2017-04-30 ENCOUNTER — Emergency Department (HOSPITAL_COMMUNITY): Payer: Medicare Other

## 2017-04-30 DIAGNOSIS — T8141XA Infection following a procedure, superficial incisional surgical site, initial encounter: Secondary | ICD-10-CM | POA: Diagnosis not present

## 2017-04-30 DIAGNOSIS — Z87891 Personal history of nicotine dependence: Secondary | ICD-10-CM | POA: Diagnosis not present

## 2017-04-30 DIAGNOSIS — W540XXA Bitten by dog, initial encounter: Secondary | ICD-10-CM

## 2017-04-30 DIAGNOSIS — I10 Essential (primary) hypertension: Secondary | ICD-10-CM | POA: Insufficient documentation

## 2017-04-30 DIAGNOSIS — L089 Local infection of the skin and subcutaneous tissue, unspecified: Secondary | ICD-10-CM

## 2017-04-30 DIAGNOSIS — Z79899 Other long term (current) drug therapy: Secondary | ICD-10-CM | POA: Diagnosis not present

## 2017-04-30 DIAGNOSIS — T148XXA Other injury of unspecified body region, initial encounter: Secondary | ICD-10-CM

## 2017-04-30 DIAGNOSIS — S61411D Laceration without foreign body of right hand, subsequent encounter: Secondary | ICD-10-CM | POA: Diagnosis not present

## 2017-04-30 DIAGNOSIS — W540XXD Bitten by dog, subsequent encounter: Secondary | ICD-10-CM | POA: Insufficient documentation

## 2017-04-30 DIAGNOSIS — Y829 Unspecified medical devices associated with adverse incidents: Secondary | ICD-10-CM | POA: Diagnosis not present

## 2017-04-30 DIAGNOSIS — L539 Erythematous condition, unspecified: Secondary | ICD-10-CM | POA: Diagnosis present

## 2017-04-30 DIAGNOSIS — S61411A Laceration without foreign body of right hand, initial encounter: Secondary | ICD-10-CM

## 2017-04-30 LAB — CBC WITH DIFFERENTIAL/PLATELET
BASOS PCT: 0 %
Basophils Absolute: 0 10*3/uL (ref 0.0–0.1)
EOS ABS: 0.1 10*3/uL (ref 0.0–0.7)
EOS PCT: 1 %
HCT: 40 % (ref 39.0–52.0)
Hemoglobin: 13.9 g/dL (ref 13.0–17.0)
LYMPHS ABS: 3 10*3/uL (ref 0.7–4.0)
Lymphocytes Relative: 45 %
MCH: 32.9 pg (ref 26.0–34.0)
MCHC: 34.8 g/dL (ref 30.0–36.0)
MCV: 94.8 fL (ref 78.0–100.0)
MONO ABS: 0.8 10*3/uL (ref 0.1–1.0)
MONOS PCT: 11 %
NEUTROS PCT: 43 %
Neutro Abs: 2.9 10*3/uL (ref 1.7–7.7)
PLATELETS: 158 10*3/uL (ref 150–400)
RBC: 4.22 MIL/uL (ref 4.22–5.81)
RDW: 12.5 % (ref 11.5–15.5)
WBC: 6.7 10*3/uL (ref 4.0–10.5)

## 2017-04-30 LAB — COMPREHENSIVE METABOLIC PANEL
ALK PHOS: 67 U/L (ref 38–126)
ALT: 26 U/L (ref 17–63)
AST: 32 U/L (ref 15–41)
Albumin: 3.9 g/dL (ref 3.5–5.0)
Anion gap: 13 (ref 5–15)
BUN: 21 mg/dL — AB (ref 6–20)
CALCIUM: 9.3 mg/dL (ref 8.9–10.3)
CHLORIDE: 98 mmol/L — AB (ref 101–111)
CO2: 27 mmol/L (ref 22–32)
CREATININE: 0.87 mg/dL (ref 0.61–1.24)
Glucose, Bld: 100 mg/dL — ABNORMAL HIGH (ref 65–99)
Potassium: 3.7 mmol/L (ref 3.5–5.1)
SODIUM: 138 mmol/L (ref 135–145)
Total Bilirubin: 1 mg/dL (ref 0.3–1.2)
Total Protein: 6.9 g/dL (ref 6.5–8.1)

## 2017-04-30 LAB — I-STAT CG4 LACTIC ACID, ED: LACTIC ACID, VENOUS: 0.82 mmol/L (ref 0.5–1.9)

## 2017-04-30 NOTE — ED Triage Notes (Signed)
Patient reports got bit by his dog on Sunday. Saw urgent care and had stitches and started on antibiotics. Patient reports that having more pain, redness and drainage-pinkish. Went back to urgent care and told to go to ED.

## 2017-04-30 NOTE — ED Provider Notes (Signed)
[facility] DEPT Provider Note   CSN: [id number] Arrival date & time: 04/30/17  1559     History   Chief Complaint Chief Complaint  Patient presents with  . infected dog bite    HPI Randy Moreno is a 72 y.o. male.  The history is provided by the patient and medical records. No language interpreter was used.  Wound Check  This is a new problem. The current episode started more than 2 days ago. The problem occurs constantly. The problem has been gradually worsening. Pertinent negatives include no chest pain, no abdominal pain, no headaches and no shortness of breath. Nothing aggravates the symptoms. Nothing relieves the symptoms. He has tried nothing for the symptoms. The treatment provided no relief.    Past Medical History:  Diagnosis Date  . Allergy   . Gout   . Hyperlipidemia   . Hypertension     Patient Active Problem List   Diagnosis Date Noted  . Peripheral neuropathy 01/17/2013  . HYPERTENSION 03/17/2007  . HEMOCCULT POSITIVE STOOL 03/17/2007  . 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 HEMORRHOIDS, INTERNAL 01/20/2001  . DIVERTICULOSIS, COLON 01/20/2001    Past Surgical History:  Procedure Laterality Date  . APPENDECTOMY  1986  . CATARACT EXTRACTION         Home Medications    Prior to Admission medications   Medication Sig Start Date End Date Taking? Authorizing Provider  amLODipine (NORVASC) 5 MG tablet Take 5 mg by mouth daily.    [provider]  amoxicillin-clavulanate (AUGMENTIN) 875-125 MG tablet Take 1 tablet by mouth every 12 (twelve) hours. 01/22/15   Dowless, Dondra Spry, PA-C  aspirin 81 MG tablet Take 81 mg by mouth daily.    [provider]  atorvastatin (LIPITOR) 20 MG tablet Take 20 mg by mouth daily.    [provider]  cholecalciferol (VITAMIN D) 1000 UNITS tablet Take 1,000 Units by mouth daily.    [provider]  colchicine 0.6 MG tablet Take 0.6 mg by mouth 2  (two) times daily.    [provider]  Cyanocobalamin (VITAMIN B 12 PO) Take by mouth daily.    [provider]  escitalopram (LEXAPRO) 10 MG tablet Take 10 mg by mouth daily.    [provider]  fish oil-omega-3 fatty acids 1000 MG capsule Take 2 g by mouth daily.    [provider]  Gabapentin Enacarbil (HORIZANT) 600 MG TBCR Take 600 mg by mouth at bedtime. 01/17/13   Drema Dallas, DO  HYDROcodone-acetaminophen (NORCO/VICODIN) 5-325 MG tablet Take 2 tablets by mouth every 4 (four) hours as needed. 01/22/15   Dowless, Aldona Bar Tripp, PA-C  losartan-hydrochlorothiazide (HYZAAR) 100-12.5 MG per tablet Take 1 tablet by mouth daily.    [provider]  Multiple Vitamin (MULTIVITAMIN) tablet Take 1 tablet by mouth daily.    [provider]  Testosterone (ANDROGEL) 40.5 MG/2.5GM (1.62%) GEL Place onto the skin daily.    [provider]  traMADol (ULTRAM) 50 MG tablet Take 1 tablet (50 mg total) by mouth every 12 (twelve) hours as needed. 04/23/17   Lanae Crumbly, PA-C    Family History Family History  Problem Relation Age of Onset  . Colon cancer Mother 65  . Heart disease Father   . Diabetes Father   . Heart Problems Paternal Grandfather     Social History Social History   Tobacco Use  . Smoking status: Former Smoker    Last attempt to  quit: 07/22/1987    Years since quitting: 29.7  . Smokeless tobacco: Never Used  Substance Use Topics  . Alcohol use: Yes    Alcohol/week: 16.8 oz    Types: 28 Glasses of wine per week    Comment: daily  . Drug use: No     Allergies   Patient has no known allergies.   Review of Systems Review of Systems  Constitutional: Negative for chills, diaphoresis, fatigue and fever.  HENT: Negative for congestion.   Respiratory: Negative for cough, chest tightness and shortness of breath.   Cardiovascular: Negative for chest pain.  Gastrointestinal: Negative for abdominal pain, constipation,  diarrhea, nausea and vomiting.  Genitourinary: Negative for enuresis and flank pain.  Musculoskeletal: Negative for back pain, neck pain and neck stiffness.  Skin: Positive for rash and wound.  Neurological: Negative for weakness, light-headedness and headaches.  Psychiatric/Behavioral: Negative for agitation and confusion.  All other systems reviewed and are negative.    Physical Exam Updated Vital Signs BP (!) 160/77 (BP Location: Left Arm)   Pulse 85   Temp 98.4 F (36.9 C) (Oral)   Resp 20   Ht 5\' 8"  (1.727 m)   Wt 87.5 kg (193 lb)   SpO2 99%   BMI 29.35 kg/m   Physical Exam  Constitutional: He is oriented to person, place, and time. He appears well-developed and well-nourished. No distress.  HENT:  Head: Normocephalic.  Nose: Nose normal.  Mouth/Throat: Oropharynx is clear and moist. No oropharyngeal exudate.  Eyes: Conjunctivae and EOM are normal. Pupils are equal, round, and reactive to light.  Neck: Normal range of motion.  Cardiovascular: Normal rate and intact distal pulses.  No murmur heard. Pulmonary/Chest: Effort normal. No stridor. No respiratory distress. He has no wheezes. He has no rales. He exhibits no tenderness.  Abdominal: Soft. He exhibits no distension. There is no tenderness.  Musculoskeletal: He exhibits no edema or tenderness.       Right hand: He exhibits laceration and swelling. Normal sensation noted. Normal strength noted.       Hands: Neurological: He is alert and oriented to person, place, and time. No cranial nerve deficit or sensory deficit. He exhibits normal muscle tone.  Skin: Capillary refill takes less than 2 seconds. He is not diaphoretic. No erythema. No pallor.  Psychiatric: He has a normal mood and affect.  Nursing note and vitals reviewed.         ED Treatments / Results  Labs (all labs ordered are listed, but only abnormal results are displayed) Labs Reviewed  COMPREHENSIVE METABOLIC PANEL - Abnormal; Notable for the  following components:      Result Value   Chloride 98 (*)    Glucose, Bld 100 (*)    BUN 21 (*)    All other components within normal limits  CBC WITH DIFFERENTIAL/PLATELET  I-STAT CG4 LACTIC ACID, ED  I-STAT CG4 LACTIC ACID, ED    EKG  EKG Interpretation None       Radiology Dg Hand Complete Right  Result Date: 04/30/2017 CLINICAL DATA:  72 year old male status post dog bite 4 days ago. Progressive redness and swelling since yesterday. EXAM: RIGHT HAND - COMPLETE 3+ VIEW COMPARISON:  01/22/2015 right hand series. FINDINGS: Calcified peripheral vascular disease at the wrist. Soft tissue swelling is evident about the palm and hypothenar eminence. No subcutaneous gas identified. No radiopaque foreign body identified. Distal radius and ulna appear stable and intact. Carpal bone alignment is stable and within normal limits. Metacarpals  and phalanges appear intact. No acute osseous abnormality identified. 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 IMPRESSION: 1. Soft tissue swelling with no soft tissue gas or radiopaque foreign body identified. 2.  No acute osseous abnormality identified. 3. Calcified peripheral vascular disease. Electronically Signed   By: Genevie Ann M.D.   On: 04/30/2017 23:56    Procedures Procedures (including critical care time)  Medications Ordered in ED Medications  clindamycin (CLEOCIN) capsule 450 mg (450 mg Oral Given 05/01/17 0204)     Initial Impression / Assessment and Plan / ED Course  I have reviewed the triage vital signs and the nursing notes.  Pertinent labs & imaging results that were available during my care of the patient were reviewed by me and considered in my medical decision making (see chart for details).     ZARIAH JOST is a 72 y.o. male with a past medical history significant for hypertension and hyperlipidemia who presents with wound infection.  Patient reports that on "Sunday, 4 days ago, he was bitten by a family  dog.  Patient was bit on his right hand he is right-hand dominant.  Patient has 3 locations of small wound which were sutured at an urgent care.  Patient reports that he was started on Augmentin and discharged home.  Patient reports that starting 2 days ago, he has had worsened erythema swelling and some purulent drainage from the sites.  He went back to urgent care today who referred him to the ED for evaluation.  He denies fevers, chills, chest pain, shortness breath, nausea, vomiting, congestion, cough.  He denies any other systemic signs of infection.  On exam, patient has 3 puncture wounds on his right hand to on the dorsum and one in the thenar eminence.  Patient has normal grip strength, normal sensation and normal capillary refill in all fingers.  Patient has normal pulses and range of motion of the wrist.  Patient has some tenderness at the locations of erythema which she reports is spreading.  Lungs clear and chest nontender.  Patient will have x-ray to look for retained foreign body or subcutaneous gas.  Patient will have the sutures removed to allow the wound to drain.  Anticipate reassessment after imaging.  X-ray showed no retained foreign body fracture cutaneous gas.  Dr. Coley with hand surgery was called who recommended removal of stitches washout at the bedside.  This was performed and sutures were removed.  Patient's wounds were washed out and cleaned.  Patient was switched to clindamycin as recommended by Dr. Coley.  Dr. Coley recommends patient follow-up with him in the next 24-48 hours reassessment and determine if further washout is needed.  Patient had no further complications and understood plan of care.  Patient's wound was redressed and family was given wrapping and gauze to continue bandaging the wound.  Patient was understanding of return precautions for any new or worsened symptoms including that of worsened infection.  Patient had no other questions or concerns and was  discharged in good condition.   Final Clinical Impressions(s) / ED Diagnoses   Final diagnoses:  Wound infection  Laceration of right hand without foreign body, initial encounter  Dog bite, initial encounter    ED Discharge Orders        Ordered    clindamycin (CLEOCIN) 150 MG capsule  3 times daily     02" /15/19 0120      Clinical Impression: 1. Wound infection   2. Laceration of right  hand without foreign body, initial encounter   3. Dog bite, initial encounter     Disposition: Discharge  Condition: Good  I have discussed the results, Dx and Tx plan with the pt(& family if present). He/she/they expressed understanding and agree(s) with the plan. Discharge instructions discussed at great length. Strict return precautions discussed and pt &/or family have verbalized understanding of the instructions. No further questions at time of discharge.    Discharge Medication List as of 05/01/2017  1:22 AM    START taking these medications   Details  clindamycin (CLEOCIN) 150 MG capsule Take 3 capsules (450 mg total) by mouth 3 (three) times daily for 7 days., Starting Fri 05/01/2017, Until Fri 05/08/2017, Print        Follow Up: Dayna Barker, [place]  [id number] [phone]  Schedule an appointment as soon as possible for a visit in 2 days      Tegeler, Gwenyth Allegra, MD 05/01/17 [phone]

## 2017-05-01 MED ORDER — CLINDAMYCIN HCL 150 MG PO CAPS: 450.0000 mg | ORAL_CAPSULE | Freq: Three times a day (TID) | ORAL | 0 refills | Status: AC

## 2017-05-01 MED ORDER — CLINDAMYCIN HCL 300 MG PO CAPS
450.0000 mg | ORAL_CAPSULE | Freq: Once | ORAL | Status: AC
  Administered 2017-05-01: 450 mg via ORAL
  Filled 2017-05-01: qty 1

## 2017-05-01 NOTE — Discharge Instructions (Signed)
Your wound today was washed out and sutures were removed to help R infection drain.  Please switch the antibiotic to the clindamycin and take it for the next week.  Please see Dr. Lenon Curt with hand surgery in the next 24-48 hours for reassessment.  Please call his office tomorrow to schedule that appointment.  If any symptoms change or worsen, please return immediately to the nearest emergency department.

## 2017-05-04 NOTE — Telephone Encounter (Signed)
Patient saw Jeneen Rinks on 2/7

## 2017-05-06 ENCOUNTER — Ambulatory Visit
Admission: RE | Admit: 2017-05-06 | Discharge: 2017-05-06 | Disposition: A | Discharge status: Home / Self Care | Payer: Medicare Other | Source: Ambulatory Visit | Attending: Surgery | Admitting: Surgery

## 2017-05-06 DIAGNOSIS — G8929 Other chronic pain: Secondary | ICD-10-CM

## 2017-05-06 DIAGNOSIS — M25511 Pain in right shoulder: Principal | ICD-10-CM

## 2017-05-06 DIAGNOSIS — M25512 Pain in left shoulder: Principal | ICD-10-CM

## 2017-05-12 ENCOUNTER — Ambulatory Visit (INDEPENDENT_AMBULATORY_CARE_PROVIDER_SITE_OTHER): Payer: Medicare Other | Admitting: Orthopaedic Surgery

## 2017-05-12 ENCOUNTER — Encounter (INDEPENDENT_AMBULATORY_CARE_PROVIDER_SITE_OTHER): Payer: Self-pay | Admitting: Orthopaedic Surgery

## 2017-05-12 VITALS — BP 136/70 | HR 78

## 2017-05-12 DIAGNOSIS — M75121 Complete rotator cuff tear or rupture of right shoulder, not specified as traumatic: Secondary | ICD-10-CM

## 2017-05-12 DIAGNOSIS — M75122 Complete rotator cuff tear or rupture of left shoulder, not specified as traumatic: Secondary | ICD-10-CM

## 2017-05-18 DIAGNOSIS — M75121 Complete rotator cuff tear or rupture of right shoulder, not specified as traumatic: Secondary | ICD-10-CM | POA: Diagnosis not present

## 2017-05-18 DIAGNOSIS — S46211A Strain of muscle, fascia and tendon of other parts of biceps, right arm, initial encounter: Secondary | ICD-10-CM | POA: Diagnosis not present

## 2017-05-19 ENCOUNTER — Encounter (INDEPENDENT_AMBULATORY_CARE_PROVIDER_SITE_OTHER): Payer: Self-pay | Admitting: Orthopaedic Surgery

## 2017-05-19 DIAGNOSIS — M75122 Complete rotator cuff tear or rupture of left shoulder, not specified as traumatic: Secondary | ICD-10-CM | POA: Insufficient documentation

## 2017-05-19 NOTE — Progress Notes (Signed)
Office Visit Note   Patient: Randy Moreno           Date of Birth: [birth year]           MRN: [id number] Visit Date: 05/12/2017              Requested by: Crist Infante, MD [address] [place], [place] [address] PCP: Crist Infante, MD   Assessment & Plan: Visit Diagnoses:  1. Complete tear of right rotator cuff   2. Complete tear of left rotator cuff     Plan: Patient has bilateral rotator cuff tears present on MRI scan which are symptomatic and have failed injection and activity modification.  He does not have atrophy of the muscle and is a candidate for rotator cuff repair.  Right side shows less retraction and is more painful.  We discussed options and he would like to proceed with outpatient surgery for rotator cuff repair after arthroscopy.  We discussed possibility of biceps tenodesis if needed.  We discussed postoperative therapy as indicated based on operative findings.  He did well with the right is happy with the result he could consider proceeding with the left since the left shoulder also does not show significant atrophy of the muscle.  Questions were elicited and answered.  He understands and requests we proceed.  We reviewed the images I gave him copy of the reports.  Patient's wife is here and we discussed questions since she gave additional history.  Follow-Up Instructions: No Follow-up on file.   Orders:  No orders of the defined types were placed in this encounter.  No orders of the defined types were placed in this encounter.     Procedures: No procedures performed   Clinical Data: No additional findings.   Subjective: Chief Complaint  Patient presents with  . Left Shoulder - Pain, Follow-up    MRI review  . Right Shoulder - Pain, Follow-up    MRI review    HPI 72 year old male returns with bilateral shoulder pain worse on the right than left.  Doing yard work in November making uploads of stone putting him in a pickup truck and had increased pain.   He has been using tramadol sparingly which helps the pain to some degree.  Outstretched activities and overhead activities bother him.  MRI scan has been obtained on 05/06/2017 right and left shoulder and are available for review.  He has been through therapy exercises, had bilateral shoulder injections done 03/19/2017.  Review of Systems view of systems unchanged from 04/23/2017 office visit other than a dog bite injury to his right hand on 04/28/2017.  These bites have now healed and had sutures placed that have been removed.   Objective: Vital Signs: BP 136/70   Pulse 78   Physical Exam  Constitutional: He is oriented to person, place, and time. He appears well-developed and well-nourished.  HENT:  Head: Normocephalic and atraumatic.  Eyes: EOM are normal. Pupils are equal, round, and reactive to light.  Neck: No tracheal deviation present. No thyromegaly present.  Cardiovascular: Normal rate.  Pulmonary/Chest: Effort normal. He has no wheezes.  Abdominal: Soft. Bowel sounds are normal.  Neurological: He is alert and oriented to person, place, and time.  Skin: Skin is warm and dry. Capillary refill takes less than 2 seconds.  Psychiatric: He has a normal mood and affect. His behavior is normal. Judgment and thought content normal.    Ortho Exam bilateral positive drop arm test.  More pain with impingement  test on the right than left.  The long head of the biceps is tender anteriorly on both right more than left.  No distal biceps migration. Specialty Comments:  No specialty comments available.  Imaging:CLINICAL DATA:  Chronic right shoulder pain. No known injury. Ongoing for 3 months.  EXAM: MRI OF THE RIGHT SHOULDER WITHOUT CONTRAST  TECHNIQUE: Multiplanar, multisequence MR imaging of the shoulder was performed. No intravenous contrast was administered.  COMPARISON:  None.  FINDINGS: Patient motion degrades image quality limiting evaluation.  Rotator cuff: Large  full-thickness, near complete, tear of the supraspinatus tendon with a few intact fibers and 2.4 cm of retraction. Moderate tendinosis of the infraspinatus tendon with a small full-thickness tear of the anterior fibers. Teres minor tendon is intact. Mild tendinosis of the subscapularis tendon with a partial-thickness tear.  Muscles: No atrophy or fatty replacement of nor abnormal signal within, the muscles of the rotator cuff.  Biceps long head: Mild tendinosis of the intraarticular portion of the long head of the biceps tendon.  Acromioclavicular Joint: Moderate arthropathy of the acromioclavicular joint. Type II acromion. Small amount of subacromial/subdeltoid bursal fluid.  Glenohumeral Joint: No joint effusion.  No chondral defect.  Labrum: Limited evaluation of the labrum secondary to lack of intra-articular fluid and patient motion degrading image quality. Diffuse labral degeneration with a posterior labral tear.  Bones:  No marrow abnormality, fracture or dislocation.  Other: No fluid collection or hematoma.  IMPRESSION: 1. Large full-thickness, near complete, tear of the supraspinatus tendon with a few intact fibers and 2.4 cm of retraction. 2. Moderate tendinosis of the infraspinatus tendon with a small full-thickness tear of the anterior fibers. 3. Mild tendinosis of the subscapularis tendon with a partial-thickness tear. 4. Mild tendinosis of the intraarticular portion of the long head of the biceps tendon.   Electronically Signed   By: Kathreen Devoid   On: 05/06/2017 09:10  ADDENDUM REPORT: 05/06/2017 09:12  ADDENDUM: Diffuse labral degeneration with a posterior labral tear.   Electronically Signed   By: Kathreen Devoid   On: 05/06/2017 09:12   Addended by Jennette Banker, MD on 05/06/2017 9:14 AM    Study Result   CLINICAL DATA:  Left shoulder pain. Right worse than left. Ongoing for 3 months. No recent injury.  EXAM: MRI OF THE LEFT  SHOULDER WITHOUT CONTRAST  TECHNIQUE: Multiplanar, multisequence MR imaging of the shoulder was performed. No intravenous contrast was administered.  COMPARISON:  None.  FINDINGS: Patient motion degrades image quality limiting evaluation.  Rotator cuff: Complete tear of the supraspinatus tendon with 3.6 cm of retraction. Moderate tendinosis of infraspinatus tendon with a full-thickness tear of the anterior half of the infraspinatus tendon. Teres minor tendon is intact. Severe tendinosis of the subscapularis tendon possible partial tear of the superior fibers.  Muscles: No atrophy or fatty replacement of nor abnormal signal within, the muscles of the rotator cuff.  Biceps long head: Mild tendinosis of the intraarticular portion of the long head of the biceps tendon.  Acromioclavicular Joint: Moderate arthropathy of the acromioclavicular joint. Type II acromion. Small amount of subacromial/subdeltoid bursal fluid.  Glenohumeral Joint: No joint effusion.  No chondral defect.  Labrum: Limited evaluation of the labrum secondary to lack of intra-articular fluid and patient motion degrading image quality. No discrete labral tear.  Bones:  No marrow abnormality, fracture or dislocation.  Other: No fluid collection or hematoma.  IMPRESSION: 1. Complete tear of the supraspinatus tendon with 3.6 cm of retraction. 2. Moderate tendinosis  of infraspinatus tendon with a full-thickness tear of the anterior half of the infraspinatus tendon. 3. Severe tendinosis of the subscapularis tendon possible partial tear of the superior fibers. 4. Mild tendinosis of the intraarticular portion of the long head of the biceps tendon.  Electronically Signed: By: Kathreen Devoid On: 05/06/2017 09:06        PMFS History: Patient Active Problem List   Diagnosis Date Noted  . Peripheral neuropathy 01/17/2013  . HYPERTENSION 03/17/2007  . HEMOCCULT POSITIVE STOOL 03/17/2007  .  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 HEMORRHOIDS, INTERNAL 01/20/2001  . DIVERTICULOSIS, COLON 01/20/2001   Past Medical History:  Diagnosis Date  . Allergy   . Gout   . Hyperlipidemia   . Hypertension     Family History  Problem Relation Age of Onset  . Colon cancer Mother 83  . Heart disease Father   . Diabetes Father   . Heart Problems Paternal Grandfather     Past Surgical History:  Procedure Laterality Date  . APPENDECTOMY  1986  . CATARACT EXTRACTION     Social History   Occupational History  . Occupation: Retired  Tobacco Use  . Smoking status: Former Smoker    Last attempt to quit: 07/22/1987    Years since quitting: 29.8  . Smokeless tobacco: Never Used  Substance and Sexual Activity  . Alcohol use: Yes    Alcohol/week: 16.8 oz    Types: 28 Glasses of wine per week    Comment: daily  . Drug use: No  . Sexual activity: Not on file

## 2017-05-21 ENCOUNTER — Telehealth (INDEPENDENT_AMBULATORY_CARE_PROVIDER_SITE_OTHER): Payer: Self-pay | Admitting: Orthopaedic Surgery

## 2017-05-21 MED ORDER — OXYCODONE-ACETAMINOPHEN 5-325 MG PO TABS: 1.0000 | ORAL_TABLET | Freq: Three times a day (TID) | ORAL | 0 refills | Status: DC | PRN

## 2017-05-21 NOTE — Telephone Encounter (Signed)
Patient requesting RX refill on Oxycodone. Please advise once ready # [phone]

## 2017-05-21 NOTE — Telephone Encounter (Signed)
Script written out. I called patient and advised. He would like to pick up in the [facility] office tomorrow morning. His wife will come and get the script.

## 2017-05-21 NOTE — Telephone Encounter (Signed)
Ok refill # 40     Percocet 5/325   One to 2 po tid prn pain thanks

## 2017-05-21 NOTE — Telephone Encounter (Signed)
Please advise 

## 2017-05-26 ENCOUNTER — Encounter (INDEPENDENT_AMBULATORY_CARE_PROVIDER_SITE_OTHER): Payer: Self-pay | Admitting: Orthopaedic Surgery

## 2017-05-26 ENCOUNTER — Ambulatory Visit (INDEPENDENT_AMBULATORY_CARE_PROVIDER_SITE_OTHER): Payer: Medicare Other | Admitting: Orthopaedic Surgery

## 2017-05-26 VITALS — BP 128/59 | HR 80

## 2017-05-26 DIAGNOSIS — M75122 Complete rotator cuff tear or rupture of left shoulder, not specified as traumatic: Secondary | ICD-10-CM

## 2017-05-26 DIAGNOSIS — Z9889 Other specified postprocedural states: Secondary | ICD-10-CM

## 2017-05-26 NOTE — Progress Notes (Signed)
   Post-Op Visit Note   Patient: Randy Moreno           Date of Birth: 1945-10-19           MRN: [id number] Visit Date: 05/26/2017 PCP: Crist Infante, MD   Assessment & Plan: Post right shoulder rotator cuff repair.  We will start some physical therapy.  Norco 30 tablets prescription given.  Chief Complaint:  Chief Complaint  Patient presents with  . Right Shoulder - Routine Post Op   Visit Diagnoses:  1. S/P right rotator cuff repair     Plan: We reviewed the intraoperative findings.  His biceps tendon had torn noted at the surgery site with some labral tearing which was debrided as well as the stump of the biceps.  Rotator cuff had fissuring and it was cut back 8 mm and fixed with 2 ultra fix anchors involving the supraspinatus tendon.  1 cm spur was removed off the acromion which was prominent.  Incision looks good.  He has been using a sling. Norco 5/325 prescribed. # 30 Follow-Up Instructions: No Follow-up on file.   Orders:  No orders of the defined types were placed in this encounter.  No orders of the defined types were placed in this encounter.   Imaging: No results found.  PMFS History: Patient Active Problem List   Diagnosis Date Noted  . Complete tear of left rotator cuff 05/19/2017  . Peripheral neuropathy 01/17/2013  . HYPERTENSION 03/17/2007  . HEMOCCULT POSITIVE STOOL 03/17/2007  . 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 HEMORRHOIDS, INTERNAL 01/20/2001  . DIVERTICULOSIS, COLON 01/20/2001   Past Medical History:  Diagnosis Date  . Allergy   . Gout   . Hyperlipidemia   . Hypertension     Family History  Problem Relation Age of Onset  . Colon cancer Mother 10  . Heart disease Father   . Diabetes Father   . Heart Problems Paternal Grandfather     Past Surgical History:  Procedure Laterality Date  . APPENDECTOMY  1986  . CATARACT EXTRACTION     Social History   Occupational History  . Occupation: Retired  Tobacco Use  . Smoking status:  Former Smoker    Last attempt to quit: 07/22/1987    Years since quitting: 29.8  . Smokeless tobacco: Never Used  Substance and Sexual Activity  . Alcohol use: Yes    Alcohol/week: 16.8 oz    Types: 28 Glasses of wine per week    Comment: daily  . Drug use: No  . Sexual activity: Not on file

## 2017-05-26 NOTE — Addendum Note (Signed)
Addended by: Meyer Cory on: 05/26/2017 04:45 PM   Modules accepted: Orders

## 2017-05-28 ENCOUNTER — Telehealth (INDEPENDENT_AMBULATORY_CARE_PROVIDER_SITE_OTHER): Payer: Self-pay | Admitting: Orthopaedic Surgery

## 2017-05-28 DIAGNOSIS — Z9889 Other specified postprocedural states: Secondary | ICD-10-CM

## 2017-05-28 NOTE — Telephone Encounter (Signed)
Patient want to change his Physical Therapy place from [facility] to [facility]. Pt states he need op note & new order sent to [facility] fax # [phone]. He had an appointment with [facility]) at 10:00 05/29/17

## 2017-05-29 ENCOUNTER — Other Ambulatory Visit: Payer: Self-pay

## 2017-05-29 ENCOUNTER — Ambulatory Visit: Payer: Medicare Other | Attending: Orthopaedic Surgery

## 2017-05-29 DIAGNOSIS — M25511 Pain in right shoulder: Secondary | ICD-10-CM | POA: Insufficient documentation

## 2017-05-29 DIAGNOSIS — M25611 Stiffness of right shoulder, not elsewhere classified: Secondary | ICD-10-CM | POA: Insufficient documentation

## 2017-05-29 DIAGNOSIS — M6281 Muscle weakness (generalized): Secondary | ICD-10-CM

## 2017-05-29 DIAGNOSIS — R293 Abnormal posture: Secondary | ICD-10-CM | POA: Insufficient documentation

## 2017-05-29 NOTE — Telephone Encounter (Signed)
New referral entered into system. Faxed copy of referral and OP note sent to [phone] per patient request.

## 2017-05-29 NOTE — Therapy (Addendum)
Randy Moreno, [place], [address] Phone: [phone]   Fax:  [phone]  Physical Therapy Evaluation  Patient Details  Name: Randy Moreno MRN: [id number] Date of Birth: 13-Mar-1946 Referring Provider: Rodell Perna, MD   Encounter Date: 05/29/2017  PT End of Session - 05/29/17 1105    Visit Number  1    Number of Visits  24    Date for PT Re-Evaluation  08/21/17    Authorization Type  [employer] MCR    PT Start Time  9326    PT Stop Time  1100    PT Time Calculation (min)  45 min    Activity Tolerance  Patient tolerated treatment well;No increased pain;Other (comment) limited per protocol    Behavior During Therapy  [facility] for tasks assessed/performed       Past Medical History:  Diagnosis Date  . Allergy   . Gout   . Hyperlipidemia   . Hypertension     Past Surgical History:  Procedure Laterality Date  . APPENDECTOMY  1986  . CATARACT EXTRACTION      There were no vitals filed for this visit.   Subjective Assessment - 05/29/17 1127    Subjective  Randy Moreno is post RT RTC repair and labral/bicep tendon debridement.   He report pain decreasing over time but variable.  He reports doing shoulder rolls and pendulum exer and using sling full time.     Pertinent History  he was moving rock into truck at time of shoulder issues starting    Limitations  -- Unable to use RT arm due to PROM    Patient Stated Goals  REturn to use RT arm normally    Currently in Pain?  Yes    Pain Score  3     Pain Location  Shoulder    Pain Orientation  Right    Pain Descriptors / Indicators  Aching    Pain Type  Surgical pain    Pain Onset  1 to 4 weeks ago    Pain Frequency  Constant    Aggravating Factors   moving arm RT    Pain Relieving Factors  rest , support    Multiple Pain Sites  No         [facility] PT Assessment - 05/29/17 0001      Assessment   Medical Diagnosis  RT RTC repair    Referring Provider  Rodell Perna, MD     Onset Date/Surgical Date  -- 05/18/17    Hand Dominance  Right    Next MD Visit  end of april    Prior Therapy  None      Precautions   Precaution Comments  PASSIVE ROM ONLY   for 6 weeks    Required Braces or Orthoses  Sling Constant wear except shower      Restrictions   Weight Bearing Restrictions  Yes    Other Position/Activity Restrictions  -- NWB RT      Balance Screen   Has the patient fallen in the past 6 months  No      [place] residence    Living Arrangements  Spouse/significant other      Prior Function   Level of Independence  Needs assistance with homemaking;Needs assistance with ADLs    Vocation  Retired    Leisure  Not able to play golf , yard work      Associate Professor  Overall Cognitive Status  Within Functional Limits for tasks assessed      Observation/Other Assessments   Focus on Therapeutic Outcomes (FOTO)   58% limited      Posture/Postural Control   Posture/Postural Control  --    Posture Comments  forward RT shoulder       ROM / Strength   AROM / PROM / Strength  AROM;PROM;Strength      AROM   Overall AROM Comments  deferred due to PROM only for 6 weeks  (4 week smore)    AROM Assessment Site  Shoulder    Right/Left Shoulder  Left;Right    Left Shoulder Extension  57 Degrees    Left Shoulder Flexion  155 Degrees    Left Shoulder ABduction  170 Degrees    Left Shoulder Internal Rotation  30 Degrees    Left Shoulder External Rotation  95 Degrees      PROM   PROM Assessment Site  Shoulder    Right/Left Shoulder  Right    Right Shoulder Extension  20 Degrees    Right Shoulder Flexion  95 Degrees    Right Shoulder ABduction  70 Degrees    Right Shoulder Internal Rotation  70 Degrees    Right Shoulder External Rotation  30 Degrees      Strength   Overall Strength Comments  deferred due to PROM only      Palpation   Palpation comment  mild tenderness  generally RT shoulder to touch              Objective measurements completed on examination: See above findings.      [facility] Adult PT Treatment/Exercise - 05/29/17 0001      Self-Care   Self-Care  Other Self-Care Comments    Other Self-Care Comments   manage pain with meds as needed, do basic exer provided previously , support RT arm when not in sling or exercisising      Exercises   Exercises  Shoulder      Shoulder Exercises: Seated   Other Seated Exercises  scapula retraction  and depression for posture             PT Education - 05/29/17 1121    Education provided  Yes    Education Details  REviewed HEP issued from previous therapist, support of RT arm out of sling, meds as needed as pain control important. No active  use of RT arm    Person(s) Educated  Patient    Methods  Explanation;Verbal cues;Tactile cues;Demonstration    Comprehension  Returned demonstration;Verbalized understanding       PT Short Term Goals - 05/29/17 1116      PT SHORT TERM GOAL #1   Title  He will report pain decr 20-30% .       Time  4    Period  Weeks    Status  New      PT SHORT TERM GOAL #2   Title  PROM within protocol limitations    Time  4    Period  Weeks    Status  New        PT Long Term Goals - 05/29/17 1117      PT LONG TERM GOAL #1   Title  He will be indpeendent with all HEP    Time  12    Period  Weeks    Status  New      PT LONG TERM GOAL #2  Title  He will have active ROM to be able to reach into cabinets and dress independnetly with RT arm.     Time  12    Period  Weeks    Status  Revised      PT LONG TERM GOAL #3   Title  FOTO score improved to   < 40 % limited to indicate  funcitonal improvement     Time  12    Period  Weeks    Status  New      PT LONG TERM GOAL #4   Title  He will report  performing  normal home tasks with RT arm. with 1-2/10 pain    Time  12    Period  Weeks    Status  New      PT LONG TERM GOAL #5   Title  He will return to light yard work with  1-2 max pain    Time  12    Period  Weeks    Status  New      Additional Long Term Goals   Additional Long Term Goals  Yes      PT LONG TERM GOAL #6   Title  He will return to golf if allowed by MD    Time  12    Period  Weeks    Status  New             Plan - 05/29/17 1106    Clinical Impression Statement  Randy Moreno is post RT RTC repair  bicep tendon/labral debridement .   He is limited by only PROM for 6 weeks. His ROM appears within protocol limits He may go to beach for 2-3 weeks at end of month.     Clinical Presentation  Stable    Clinical Decision Making  Low    Rehab Potential  Good    PT Frequency  2x / week    PT Duration  12 weeks    PT Treatment/Interventions  Cryotherapy;Passive range of motion;Manual techniques;Patient/family education    PT Next Visit Plan  PROM at 3 -4 weeks of protocol ROM ,                                    PROM only    PT Home Exercise Plan  scapula retract and depression , pendulum     Consulted and Agree with Plan of Care  Patient       Patient will benefit from skilled therapeutic intervention in order to improve the following deficits and impairments:  Pain, Impaired UE functional use, Decreased range of motion, Decreased strength, Decreased activity tolerance, Postural dysfunction  Visit Diagnosis: Acute pain of right shoulder - Plan: PT plan of care cert/re-cert  Stiffness of right shoulder, not elsewhere classified - Plan: PT plan of care cert/re-cert  Muscle weakness (generalized) - Plan: PT plan of care cert/re-cert  Abnormal posture - Plan: PT plan of care cert/re-cert     Problem List Patient Active Problem List   Diagnosis Date Noted  . Complete tear of left rotator cuff 05/19/2017  . Peripheral neuropathy 01/17/2013  . HYPERTENSION 03/17/2007  . HEMOCCULT POSITIVE STOOL 03/17/2007  . 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 HEMORRHOIDS, INTERNAL 01/20/2001  . DIVERTICULOSIS, COLON 01/20/2001    [name]   PT 05/29/2017, 11:56 AM  [place]  [place], [place], [address] Phone: [phone]   Fax:  [phone]  Name: KHYRAN RIERA MRN: [id number] Date of Birth: 1945-05-22

## 2017-06-01 ENCOUNTER — Encounter: Payer: Self-pay | Admitting: Physical Therapy

## 2017-06-01 ENCOUNTER — Ambulatory Visit: Payer: Medicare Other | Admitting: Physical Therapy

## 2017-06-01 DIAGNOSIS — M25611 Stiffness of right shoulder, not elsewhere classified: Secondary | ICD-10-CM

## 2017-06-01 DIAGNOSIS — M25511 Pain in right shoulder: Secondary | ICD-10-CM | POA: Diagnosis not present

## 2017-06-01 DIAGNOSIS — M6281 Muscle weakness (generalized): Secondary | ICD-10-CM

## 2017-06-01 DIAGNOSIS — R293 Abnormal posture: Secondary | ICD-10-CM

## 2017-06-01 NOTE — Therapy (Signed)
[place], [place], [address] Phone: [phone]   Fax:  [phone]  Physical Therapy Treatment  Patient Details  Name: Randy Moreno MRN: [id number] Date of Birth: 09-20-1945 Referring Provider: Rodell Perna, MD   Encounter Date: 06/01/2017  PT End of Session - 06/01/17 0941    Visit Number  2    Number of Visits  24    Date for PT Re-Evaluation  08/21/17    Authorization Type  UHC MCR    PT Start Time  0845    PT Stop Time  0939    PT Time Calculation (min)  54 min    Activity Tolerance  Patient tolerated treatment well;No increased pain    Behavior During Therapy  [facility] for tasks assessed/performed       Past Medical History:  Diagnosis Date  . Allergy   . Gout   . Hyperlipidemia   . Hypertension     Past Surgical History:  Procedure Laterality Date  . APPENDECTOMY  1986  . CATARACT EXTRACTION      There were no vitals filed for this visit.  Subjective Assessment - 06/01/17 1032    Subjective  Patien rpeorts he is sore but overall is doing well.     Pertinent History  he was moving rock into truck at time of shoulder issues starting    Currently in Pain?  Yes    Pain Score  3     Pain Location  Shoulder    Pain Orientation  Right    Pain Descriptors / Indicators  Aching    Pain Type  Surgical pain    Pain Onset  1 to 4 weeks ago    Pain Frequency  Constant    Aggravating Factors   moving arm movement     Pain Relieving Factors  rest, support     Effect of Pain on Daily Activities  unable to use his right UE                       [facility] Adult PT Treatment/Exercise - 06/01/17 0001      Elbow Exercises   Elbow Flexion  -- reviewed wrest flexion/ extnesion and towell squeeze      Shoulder Exercises: Standing   Other Standing Exercises  pendulum  4 way x10       Manual Therapy   Manual Therapy  Soft tissue mobilization;Passive ROM;Joint mobilization    Joint  Mobilization  gentle grade 1 AP and inferior glides for pain     Soft tissue mobilization  to upper trap     Passive ROM  in all planes per protocol              PT Education - 06/01/17 0941    Education provided  Yes    Education Details  reviewed pendulums     Person(s) Educated  Patient    Methods  Explanation;Demonstration;Tactile cues;Verbal cues    Comprehension  Verbalized understanding;Returned demonstration;Verbal cues required;Tactile cues required;Need further instruction       PT Short Term Goals - 06/01/17 1443      PT SHORT TERM GOAL #1   Title  He will report pain decr 20-30% .       Time  4    Period  Weeks    Status  On-going      PT SHORT TERM GOAL #2   Title  PROM within protocol limitations  Baseline  progressing as expected     Time  4    Period  Weeks    Status  On-going        PT Long Term Goals - 05/29/17 1117      PT LONG TERM GOAL #1   Title  He will be indpeendent with all HEP    Time  12    Period  Weeks    Status  New      PT LONG TERM GOAL #2   Title  He will have active ROM to be able to reach into cabinets and dress independnetly with RT arm.     Time  12    Period  Weeks    Status  Revised      PT LONG TERM GOAL #3   Title  FOTO score improved to   < 40 % limited to indicate  funcitonal improvement     Time  12    Period  Weeks    Status  New      PT LONG TERM GOAL #4   Title  He will report  performing  normal home tasks with RT arm. with 1-2/10 pain    Time  12    Period  Weeks    Status  New      PT LONG TERM GOAL #5   Title  He will return to light yard work with 1-2 max pain    Time  12    Period  Weeks    Status  New      Additional Long Term Goals   Additional Long Term Goals  Yes      PT LONG TERM GOAL #6   Title  He will return to golf if allowed by MD    Time  12    Period  Weeks    Status  New            Plan - 06/01/17 0951    Clinical Impression Statement  Patient tolerated  treatment wll. His ranges are progressing as expected. Therapy reviewed pendulum exercises.     Clinical Presentation  Stable    Clinical Decision Making  Low    Rehab Potential  Good    PT Frequency  2x / week    PT Duration  12 weeks    PT Treatment/Interventions  Cryotherapy;Passive range of motion;Manual techniques;Patient/family education    PT Next Visit Plan  PROM at 3 -4 weeks of protocol ROM ,                                    PROM only    PT Home Exercise Plan  scapula retract and depression , pendulum     Consulted and Agree with Plan of Care  Patient       Patient will benefit from skilled therapeutic intervention in order to improve the following deficits and impairments:  Pain, Impaired UE functional use, Decreased range of motion, Decreased strength, Decreased activity tolerance, Postural dysfunction  Visit Diagnosis: Acute pain of right shoulder  Stiffness of right shoulder, not elsewhere classified  Muscle weakness (generalized)  Abnormal posture     Problem List Patient Active Problem List   Diagnosis Date Noted  . Complete tear of left rotator cuff 05/19/2017  . Peripheral neuropathy 01/17/2013  . HYPERTENSION 03/17/2007  . HEMOCCULT POSITIVE STOOL 03/17/2007  . COLONIC POLYPS, ADENOMATOUS  01/20/2001  . HEMORRHOIDS, INTERNAL 01/20/2001  . DIVERTICULOSIS, COLON 01/20/2001    Carney Living PT DPT  06/01/2017, 10:33 AM  [facility] [address] [place], [place], [address] Phone: [phone]   Fax:  [phone]  Name: Randy Moreno MRN: [id number] Date of Birth: April 14, 1945

## 2017-06-03 ENCOUNTER — Encounter: Payer: Self-pay | Admitting: Physical Therapy

## 2017-06-03 ENCOUNTER — Ambulatory Visit: Payer: Medicare Other | Admitting: Physical Therapy

## 2017-06-03 DIAGNOSIS — R293 Abnormal posture: Secondary | ICD-10-CM

## 2017-06-03 DIAGNOSIS — M25511 Pain in right shoulder: Secondary | ICD-10-CM

## 2017-06-03 DIAGNOSIS — M25611 Stiffness of right shoulder, not elsewhere classified: Secondary | ICD-10-CM

## 2017-06-03 DIAGNOSIS — M6281 Muscle weakness (generalized): Secondary | ICD-10-CM

## 2017-06-03 NOTE — Therapy (Signed)
[place], [place], [address] Phone: [phone]   Fax:  [phone]  Physical Therapy Treatment  Patient Details  Name: Randy Moreno MRN: [id number] Date of Birth: 01-29-1946 Referring Provider: Rodell Perna, MD   Encounter Date: 06/03/2017  PT End of Session - 06/03/17 1015    Visit Number  3    Number of Visits  24    Date for PT Re-Evaluation  08/21/17    PT Start Time  0933    PT Stop Time  1038    PT Time Calculation (min)  65 min    Activity Tolerance  Patient tolerated treatment well    Behavior During Therapy  [facility] for tasks assessed/performed       Past Medical History:  Diagnosis Date  . Allergy   . Gout   . Hyperlipidemia   . Hypertension     Past Surgical History:  Procedure Laterality Date  . APPENDECTOMY  1986  . CATARACT EXTRACTION      There were no vitals filed for this visit.  Subjective Assessment - 06/03/17 0938    Subjective  No pain meds.    I have been doing the ex several times a day,  Sling on except for shower.    Currently in Pain?  Yes    Pain Score  3     Pain Location  Shoulder    Pain Orientation  Right;Anterior    Pain Descriptors / Indicators  Aching    Pain Type  Surgical pain    Pain Frequency  Intermittent    Aggravating Factors   movement    Pain Relieving Factors  rest support    Effect of Pain on Daily Activities  limited by protocol    Multiple Pain Sites  -- bACK PAIN 2/10                      [facility] Adult PT Treatment/Exercise - 06/03/17 0001      Shoulder Exercises: Standing   Other Standing Exercises  pendulum  4 way x10       Modalities   Modalities  Cryotherapy      Cryotherapy   Number Minutes Cryotherapy  15 Minutes    Cryotherapy Location  Shoulder    Type of Cryotherapy  -- cold pack      Manual Therapy   Manual Therapy  Soft tissue mobilization;Passive ROM;Joint mobilization    Joint Mobilization  gentle grade  1 AP and inferior glides for pain     Soft tissue mobilization  upper trap relaxed today,  to forearm  proximal arm    Passive ROM  in all planes per protocol              PT Education - 06/03/17 1014    Education provided  Yes    Education Details  precautions    Person(s) Educated  Patient    Methods  Explanation    Comprehension  Verbalized understanding       PT Short Term Goals - 06/01/17 0952      PT SHORT TERM GOAL #1   Title  He will report pain decr 20-30% .       Time  4    Period  Weeks    Status  On-going      PT SHORT TERM GOAL #2   Title  PROM within protocol limitations    Baseline  progressing as expected  Time  4    Period  Weeks    Status  On-going        PT Long Term Goals - 05/29/17 1117      PT LONG TERM GOAL #1   Title  He will be indpeendent with all HEP    Time  12    Period  Weeks    Status  New      PT LONG TERM GOAL #2   Title  He will have active ROM to be able to reach into cabinets and dress independnetly with RT arm.     Time  12    Period  Weeks    Status  Revised      PT LONG TERM GOAL #3   Title  FOTO score improved to   < 40 % limited to indicate  funcitonal improvement     Time  12    Period  Weeks    Status  New      PT LONG TERM GOAL #4   Title  He will report  performing  normal home tasks with RT arm. with 1-2/10 pain    Time  12    Period  Weeks    Status  New      PT LONG TERM GOAL #5   Title  He will return to light yard work with 1-2 max pain    Time  12    Period  Weeks    Status  New      Additional Long Term Goals   Additional Long Term Goals  Yes      PT LONG TERM GOAL #6   Title  He will return to golf if allowed by MD    Time  12    Period  Weeks    Status  New            Plan - 06/03/17 1015    Clinical Impression Statement  Patient tolerated PROM  today as allowed.  after session pain had not increased.  pendelum peformed reasonably well.     PT Next Visit Plan  PROM at 3 -4  weeks of protocol ROM ,                                    PROM only    PT Home Exercise Plan  scapula retract and depression , pendulum     Consulted and Agree with Plan of Care  Patient       Patient will benefit from skilled therapeutic intervention in order to improve the following deficits and impairments:     Visit Diagnosis: Acute pain of right shoulder  Stiffness of right shoulder, not elsewhere classified  Muscle weakness (generalized)  Abnormal posture     Problem List Patient Active Problem List   Diagnosis Date Noted  . Complete tear of left rotator cuff 05/19/2017  . Peripheral neuropathy 01/17/2013  . HYPERTENSION 03/17/2007  . HEMOCCULT POSITIVE STOOL 03/17/2007  . 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 HEMORRHOIDS, INTERNAL 01/20/2001  . DIVERTICULOSIS, COLON 01/20/2001    Randy Moreno   PTA 06/03/2017, 10:18 AM  [facility] [address] [place], [place], [address] Phone: [phone]   Fax:  [phone]  Name: Randy Moreno MRN: [id number] Date of Birth: October 11, 1945

## 2017-06-09 ENCOUNTER — Ambulatory Visit: Payer: Medicare Other | Admitting: Physical Therapy

## 2017-06-09 ENCOUNTER — Encounter: Payer: Self-pay | Admitting: Physical Therapy

## 2017-06-09 DIAGNOSIS — M25611 Stiffness of right shoulder, not elsewhere classified: Secondary | ICD-10-CM

## 2017-06-09 DIAGNOSIS — M6281 Muscle weakness (generalized): Secondary | ICD-10-CM

## 2017-06-09 DIAGNOSIS — M25511 Pain in right shoulder: Secondary | ICD-10-CM

## 2017-06-09 DIAGNOSIS — R293 Abnormal posture: Secondary | ICD-10-CM

## 2017-06-09 NOTE — Therapy (Signed)
[place], [place], [address] Phone: [phone]   Fax:  [phone]  Physical Therapy Treatment  Patient Details  Name: Randy Moreno MRN: [id number] Date of Birth: 1945-04-26 Referring Provider: Rodell Perna, MD   Encounter Date: 06/09/2017  PT End of Session - 06/09/17 1100    Visit Number  4    Number of Visits  24    Date for PT Re-Evaluation  08/21/17    Authorization Type  UHC MCR    PT Start Time  1016    PT Stop Time  1055    PT Time Calculation (min)  39 min    Activity Tolerance  Patient tolerated treatment well    Behavior During Therapy  [facility] for tasks assessed/performed       Past Medical History:  Diagnosis Date  . Allergy   . Gout   . Hyperlipidemia   . Hypertension     Past Surgical History:  Procedure Laterality Date  . APPENDECTOMY  1986  . CATARACT EXTRACTION      There were no vitals filed for this visit.  Subjective Assessment - 06/09/17 1017    Subjective  Patient arrives today reporting he is doing well, he sees his MD on the 23rd. He will be out of town and plans to bring his wife soon so she can "coach" him     Pertinent History  he was moving rock into truck at time of shoulder issues starting    Patient Stated Goals  REturn to use RT arm normally    Currently in Pain?  Yes    Pain Score  2     Pain Location  Shoulder    Pain Orientation  Right;Anterior    Pain Descriptors / Indicators  Aching    Pain Type  Surgical pain    Pain Onset  1 to 4 weeks ago    Pain Frequency  Intermittent    Aggravating Factors   movement     Pain Relieving Factors  rest/support     Effect of Pain on Daily Activities  limited per protocol                 No data recorded       [facility] Adult PT Treatment/Exercise - 06/09/17 0001      Shoulder Exercises: Supine   External Rotation  PROM;20 reps;Other (comment) per protocol     Flexion  PROM;20 reps per protocol  (unlimited); approximately 160-170 degrees     ABduction  PROM;20 reps;Other (comment) per protocol      Shoulder Exercises: Seated   Retraction  15 reps;Other (comment) 3 second holds     Other Seated Exercises  washcloth grip squeezes 1x20 5 second holds     Other Seated Exercises  seated biceps curls with R shoulder stationary 1x10 4#; shoulder rolls 1x15; wrist flexion/extension 1x20 0# shoulder stationary        Manual Therapy   Manual Therapy  Joint mobilization    Manual therapy comments  performed separately from all other acttivities this session     Joint Mobilization  gentle grade 1 inferior and AP mobs 6x30 seconds R shoulder              PT Education - 06/09/17 1059    Education provided  Yes    Education Details  reviewed precautions, general course of rehab following this type of surgery     Person(s) Educated  Patient    Methods  Explanation    Comprehension  Verbalized understanding       PT Short Term Goals - 06/01/17 7322      PT SHORT TERM GOAL #1   Title  He will report pain decr 20-30% .       Time  4    Period  Weeks    Status  On-going      PT SHORT TERM GOAL #2   Title  PROM within protocol limitations    Baseline  progressing as expected     Time  4    Period  Weeks    Status  On-going        PT Long Term Goals - 05/29/17 1117      PT LONG TERM GOAL #1   Title  He will be indpeendent with all HEP    Time  12    Period  Weeks    Status  New      PT LONG TERM GOAL #2   Title  He will have active ROM to be able to reach into cabinets and dress independnetly with RT arm.     Time  12    Period  Weeks    Status  Revised      PT LONG TERM GOAL #3   Title  FOTO score improved to   < 40 % limited to indicate  funcitonal improvement     Time  12    Period  Weeks    Status  New      PT LONG TERM GOAL #4   Title  He will report  performing  normal home tasks with RT arm. with 1-2/10 pain    Time  12    Period  Weeks    Status  New       PT LONG TERM GOAL #5   Title  He will return to light yard work with 1-2 max pain    Time  12    Period  Weeks    Status  New      Additional Long Term Goals   Additional Long Term Goals  Yes      PT LONG TERM GOAL #6   Title  He will return to golf if allowed by MD    Time  12    Period  Weeks    Status  New            Plan - 06/09/17 1101    Clinical Impression Statement  Continued working on PROM as per protocol, also incorporated gentle grade I joint mobilizations which patient was able to tolerate well today. Otherwise worked on periscapular strength and mobility today, also including exercise for elbow/wrist/gripping musculature with R shoulder stationary. Patient tolerated session well with no increase in pain; general education provided regarding typical course of rehab following this type of surgery.     Rehab Potential  Good    PT Frequency  2x / week    PT Duration  12 weeks    PT Treatment/Interventions  Cryotherapy;Passive range of motion;Manual techniques;Patient/family education    PT Next Visit Plan  PROM at 3 -4 weeks of protocol ROM ,                                    PROM only    PT Home Exercise Plan  scapula retract  and depression , pendulum     Consulted and Agree with Plan of Care  Patient       Patient will benefit from skilled therapeutic intervention in order to improve the following deficits and impairments:  Pain, Impaired UE functional use, Decreased range of motion, Decreased strength, Decreased activity tolerance, Postural dysfunction  Visit Diagnosis: Acute pain of right shoulder  Stiffness of right shoulder, not elsewhere classified  Muscle weakness (generalized)  Abnormal posture     Problem List Patient Active Problem List   Diagnosis Date Noted  . Complete tear of left rotator cuff 05/19/2017  . Peripheral neuropathy 01/17/2013  . HYPERTENSION 03/17/2007  . HEMOCCULT POSITIVE STOOL 03/17/2007  . COLONIC POLYPS,  ADENOMATOUS 01/20/2001  . HEMORRHOIDS, INTERNAL 01/20/2001  . DIVERTICULOSIS, COLON 01/20/2001    Deniece Ree PT, DPT, CBIS  Supplemental Physical Therapist [place]   Pager [facility] [place] [address] [place], [place], [address] Phone: [phone]   Fax:  [phone]  Name: AKHIL PISCOPO MRN: [id number] Date of Birth: May 31, 1945

## 2017-06-10 ENCOUNTER — Ambulatory Visit: Payer: Medicare Other

## 2017-06-10 DIAGNOSIS — M25611 Stiffness of right shoulder, not elsewhere classified: Secondary | ICD-10-CM

## 2017-06-10 DIAGNOSIS — M25511 Pain in right shoulder: Secondary | ICD-10-CM

## 2017-06-10 DIAGNOSIS — M6281 Muscle weakness (generalized): Secondary | ICD-10-CM

## 2017-06-10 DIAGNOSIS — R293 Abnormal posture: Secondary | ICD-10-CM

## 2017-06-10 NOTE — Patient Instructions (Signed)
External Rotation (Passive)    Lying on back, place arm out to side with elbow at right angle, using other arm to help. Hold ____ seconds. Repeat ____ times. Do ____ sessions per day.  Copyright  VHI. All rights reserved.                                                                                                                                                                                                                                                                                                                          Passive ER                                                                                                                                   Passive flexion

## 2017-06-10 NOTE — Therapy (Signed)
[facility], [place], [address] Phone: [phone]   Fax:  [phone]  Physical Therapy Treatment  Patient Details  Name: Randy Moreno MRN: [id number] Date of Birth: [birth year] Referring Provider: Rodell Perna, MD   Encounter Date: 06/10/2017  PT End of Session - 06/10/17 0836    Visit Number  5    Number of Visits  24    Date for PT Re-Evaluation  08/21/17    Authorization Type  UHC MCR    PT Start Time  0800    PT Stop Time  0838    PT Time Calculation (min)  38 min    Activity Tolerance  Patient tolerated treatment well    Behavior During Therapy  [facility] for tasks assessed/performed       Past Medical History:  Diagnosis Date  . Allergy   . Gout   . Hyperlipidemia   . Hypertension     Past Surgical History:  Procedure Laterality Date  . APPENDECTOMY  1986  . CATARACT EXTRACTION      There were no vitals filed for this visit.  Subjective Assessment - 06/10/17 0803    Subjective  No problems since yesterday. a little sore from PT. Spous e here to observe /learn ROM    Patient is accompained by:  Family member    Pain Score  2     Pain Location  Shoulder    Pain Orientation  Right    Pain Descriptors / Indicators  Sore    Pain Type  Surgical pain    Pain Onset  More than a month ago    Pain Frequency  Intermittent                No data recorded    Spent treatment educating spouse to [perform PROM while on vacation. And cuing for scapula retraction . She appeared safe /competent in performing  ROM            PT Education - 06/10/17 0823    Education provided  Yes    Education Details  PROM RT shoulder 30 sec 2-3 reps 2-3xday     Person(s) Educated  Patient;Spouse    Methods  Explanation;Tactile cues;Verbal cues;Handout;Demonstration    Comprehension  Returned demonstration;Verbalized understanding       PT Short Term Goals - 06/01/17 0952      PT SHORT TERM  GOAL #1   Title  He will report pain decr 20-30% .       Time  4    Period  Weeks    Status  On-going      PT SHORT TERM GOAL #2   Title  PROM within protocol limitations    Baseline  progressing as expected     Time  4    Period  Weeks    Status  On-going        PT Long Term Goals - 05/29/17 1117      PT LONG TERM GOAL #1   Title  He will be indpeendent with all HEP    Time  12    Period  Weeks    Status  New      PT LONG TERM GOAL #2   Title  He will have active ROM to be able to reach into cabinets and dress independnetly with RT arm.     Time  12    Period  Weeks    Status  Revised  PT LONG TERM GOAL #3   Title  FOTO score improved to   < 40 % limited to indicate  funcitonal improvement     Time  12    Period  Weeks    Status  New      PT LONG TERM GOAL #4   Title  He will report  performing  normal home tasks with RT arm. with 1-2/10 pain    Time  12    Period  Weeks    Status  New      PT LONG TERM GOAL #5   Title  He will return to light yard work with 1-2 max pain    Time  12    Period  Weeks    Status  New      Additional Long Term Goals   Additional Long Term Goals  Yes      PT LONG TERM GOAL #6   Title  He will return to golf if allowed by MD    Time  12    Period  Weeks    Status  New            Plan - 06/10/17 4650    Clinical Impression Statement  Range progressing nicely. wife was able to correctly do PROM safely.  As long as they obey precautions they should be safe while on vacation.  Pain as guide     PT Treatment/Interventions  Cryotherapy;Passive range of motion;Manual techniques;Patient/family education    PT Next Visit Plan  PROM at 3 -4 weeks of protocol ROM ,                                    PROM only    PT Home Exercise Plan  scapula retract and depression , pendulum     Consulted and Agree with Plan of Care  Patient;Family member/caregiver    Family Member Consulted  spouse       Patient will benefit from  skilled therapeutic intervention in order to improve the following deficits and impairments:  Pain, Impaired UE functional use, Decreased range of motion, Decreased strength, Decreased activity tolerance, Postural dysfunction  Visit Diagnosis: Acute pain of right shoulder  Stiffness of right shoulder, not elsewhere classified  Muscle weakness (generalized)  Abnormal posture     Problem List Patient Active Problem List   Diagnosis Date Noted  . Complete tear of left rotator cuff 05/19/2017  . Peripheral neuropathy 01/17/2013  . HYPERTENSION 03/17/2007  . HEMOCCULT POSITIVE STOOL 03/17/2007  . 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 HEMORRHOIDS, INTERNAL 01/20/2001  . DIVERTICULOSIS, COLON 01/20/2001    [name]  PT 06/10/2017, 8:40 AM  [facility] [address] [place], [place], [address] Phone: [phone]   Fax:  [phone]  Name: Randy Moreno MRN: [id number] Date of Birth: 1945-05-31

## 2017-06-22 ENCOUNTER — Ambulatory Visit: Payer: Medicare Other | Attending: Orthopaedic Surgery

## 2017-06-22 DIAGNOSIS — M25511 Pain in right shoulder: Secondary | ICD-10-CM | POA: Insufficient documentation

## 2017-06-22 DIAGNOSIS — M6281 Muscle weakness (generalized): Secondary | ICD-10-CM

## 2017-06-22 DIAGNOSIS — R293 Abnormal posture: Secondary | ICD-10-CM

## 2017-06-22 DIAGNOSIS — M25611 Stiffness of right shoulder, not elsewhere classified: Secondary | ICD-10-CM | POA: Insufficient documentation

## 2017-06-22 NOTE — Therapy (Signed)
[place], [place], [address] Phone: [phone]   Fax:  [phone]  Physical Therapy Treatment  Patient Details  Name: Randy Moreno MRN: [id number] Date of Birth: [birth year] Referring Provider: Rodell Perna, MD   Encounter Date: 06/22/2017  PT End of Session - 06/22/17 1058    Visit Number  6    Number of Visits  24    Date for PT Re-Evaluation  08/21/17    Authorization Type  UHC MCR    PT Start Time  2426    PT Stop Time  1053    PT Time Calculation (min)  38 min    Activity Tolerance  Patient tolerated treatment well    Behavior During Therapy  [facility] for tasks assessed/performed       Past Medical History:  Diagnosis Date  . Allergy   . Gout   . Hyperlipidemia   . Hypertension     Past Surgical History:  Procedure Laterality Date  . APPENDECTOMY  1986  . CATARACT EXTRACTION      There were no vitals filed for this visit.                    [facility] Adult PT Treatment/Exercise - 06/22/17 0001      Manual Therapy   Joint Mobilization  gentle grade 1 inferior and AP mobs 6x30 seconds R shoulder     Passive ROM  in all planes per protocol                PT Short Term Goals - 06/01/17 8341      PT SHORT TERM GOAL #1   Title  He will report pain decr 20-30% .       Time  4    Period  Weeks    Status  On-going      PT SHORT TERM GOAL #2   Title  PROM within protocol limitations    Baseline  progressing as expected     Time  4    Period  Weeks    Status  On-going        PT Long Term Goals - 05/29/17 1117      PT LONG TERM GOAL #1   Title  He will be indpeendent with all HEP    Time  12    Period  Weeks    Status  New      PT LONG TERM GOAL #2   Title  He will have active ROM to be able to reach into cabinets and dress independnetly with RT arm.     Time  12    Period  Weeks    Status  Revised      PT LONG TERM GOAL #3   Title  FOTO score improved  to   < 40 % limited to indicate  funcitonal improvement     Time  12    Period  Weeks    Status  New      PT LONG TERM GOAL #4   Title  He will report  performing  normal home tasks with RT arm. with 1-2/10 pain    Time  12    Period  Weeks    Status  New      PT LONG TERM GOAL #5   Title  He will return to light yard work with 1-2 max pain    Time  12    Period  Weeks    Status  New      Additional Long Term Goals   Additional Long Term Goals  Yes      PT LONG TERM GOAL #6   Title  He will return to golf if allowed by MD    Time  12    Period  Weeks    Status  New            Plan - 06/22/17 1014    Clinical Impression Statement  Range in good . Pain controlled.  ready next week for [facility]     PT Treatment/Interventions  Cryotherapy;Passive range of motion;Manual techniques;Patient/family education    PT Next Visit Plan                                   PROM only this week then [facility]    PT Home Exercise Plan  scapula retract and depression , pendulum     Consulted and Agree with Plan of Care  Patient       Patient will benefit from skilled therapeutic intervention in order to improve the following deficits and impairments:  Pain, Impaired UE functional use, Decreased range of motion, Decreased strength, Decreased activity tolerance, Postural dysfunction  Visit Diagnosis: Acute pain of right shoulder  Stiffness of right shoulder, not elsewhere classified  Muscle weakness (generalized)  Abnormal posture     Problem List Patient Active Problem List   Diagnosis Date Noted  . Complete tear of left rotator cuff 05/19/2017  . Peripheral neuropathy 01/17/2013  . HYPERTENSION 03/17/2007  . HEMOCCULT POSITIVE STOOL 03/17/2007  . 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 HEMORRHOIDS, INTERNAL 01/20/2001  . DIVERTICULOSIS, COLON 01/20/2001    [name]  PT 06/22/2017, 11:02 AM  [facility] [address] [place], [place], [address] Phone: [phone]   Fax:  [phone]  Name: CHETAN MEHRING MRN: [id number] Date of Birth: [birth year]

## 2017-06-25 ENCOUNTER — Ambulatory Visit: Payer: Medicare Other

## 2017-06-25 DIAGNOSIS — M25611 Stiffness of right shoulder, not elsewhere classified: Secondary | ICD-10-CM

## 2017-06-25 DIAGNOSIS — M25511 Pain in right shoulder: Secondary | ICD-10-CM | POA: Diagnosis not present

## 2017-06-25 DIAGNOSIS — R293 Abnormal posture: Secondary | ICD-10-CM

## 2017-06-25 DIAGNOSIS — M6281 Muscle weakness (generalized): Secondary | ICD-10-CM

## 2017-06-25 NOTE — Therapy (Signed)
[place], [place], [address] Phone: [phone]   Fax:  [phone]  Physical Therapy Treatment  Patient Details  Name: Randy Moreno MRN: [id number] Date of Birth: May 25, 1945 Referring Provider: Rodell Perna, MD   Encounter Date: 06/25/2017  PT End of Session - 06/25/17 1013    Visit Number  7    Number of Visits  24    Date for PT Re-Evaluation  08/21/17    Authorization Type  UHC MCR    PT Start Time  6144    PT Stop Time  1055    PT Time Calculation (min)  40 min    Activity Tolerance  Patient tolerated treatment well    Behavior During Therapy  [facility] for tasks assessed/performed       Past Medical History:  Diagnosis Date  . Allergy   . Gout   . Hyperlipidemia   . Hypertension     Past Surgical History:  Procedure Laterality Date  . APPENDECTOMY  1986  . CATARACT EXTRACTION      There were no vitals filed for this visit.  Subjective Assessment - 06/25/17 1057    Subjective  No problems since last sesion     Pain Score  1     Pain Location  Shoulder    Pain Orientation  Right    Pain Descriptors / Indicators  Sore    Pain Type  Surgical pain    Pain Onset  More than a month ago    Pain Frequency  Intermittent    Aggravating Factors   movement    Pain Relieving Factors  rest/support                       [facility] Adult PT Treatment/Exercise - 06/25/17 0001      Manual Therapy   Joint Mobilization  gentle grade 1 inferior and AP mobs 6x30 seconds R shoulder     Passive ROM  in all planes per protocol                PT Short Term Goals - 06/25/17 1055      PT SHORT TERM GOAL #1   Title  He will report pain decr 20-30% .       Status  Achieved      PT SHORT TERM GOAL #2   Title  PROM within protocol limitations    Status  Achieved        PT Long Term Goals - 05/29/17 1117      PT LONG TERM GOAL #1   Title  He will be indpeendent with all HEP    Time  12    Period  Weeks    Status  New      PT LONG TERM GOAL #2   Title  He will have active ROM to be able to reach into cabinets and dress independnetly with RT arm.     Time  12    Period  Weeks    Status  Revised      PT LONG TERM GOAL #3   Title  FOTO score improved to   < 40 % limited to indicate  funcitonal improvement     Time  12    Period  Weeks    Status  New      PT LONG TERM GOAL #4   Title  He will report  performing  normal home tasks  with RT arm. with 1-2/10 pain    Time  12    Period  Weeks    Status  New      PT LONG TERM GOAL #5   Title  He will return to light yard work with 1-2 max pain    Time  12    Period  Weeks    Status  New      Additional Long Term Goals   Additional Long Term Goals  Yes      PT LONG TERM GOAL #6   Title  He will return to golf if allowed by MD    Time  12    Period  Weeks    Status  New            Plan - 06/25/17 1015    Clinical Impression Statement  Range continues [facility] passively with only mild end range pain and this was done with only gentle pressure. He will progress to [facility]  when he returns from vacation    PT Treatment/Interventions  Cryotherapy;Passive range of motion;Manual techniques;Patient/family education    PT Next Visit Plan                                   PROM only this week then [facility]    PT Home Exercise Plan  scapula retract and depression , pendulum     Consulted and Agree with Plan of Care  Patient       Patient will benefit from skilled therapeutic intervention in order to improve the following deficits and impairments:  Pain, Impaired UE functional use, Decreased range of motion, Decreased strength, Decreased activity tolerance, Postural dysfunction  Visit Diagnosis: Acute pain of right shoulder  Stiffness of right shoulder, not elsewhere classified  Muscle weakness (generalized)  Abnormal posture     Problem List Patient Active Problem List   Diagnosis Date Noted  . Complete  tear of left rotator cuff 05/19/2017  . Peripheral neuropathy 01/17/2013  . HYPERTENSION 03/17/2007  . HEMOCCULT POSITIVE STOOL 03/17/2007  . 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 HEMORRHOIDS, INTERNAL 01/20/2001  . DIVERTICULOSIS, COLON 01/20/2001    [name]  PT 06/25/2017, 10:58 AM  [facility] [address] [place], [place], [address] Phone: [phone]   Fax:  [phone]  Name: Randy Moreno MRN: [id number] Date of Birth: 1945/10/21

## 2017-07-06 ENCOUNTER — Ambulatory Visit: Payer: Medicare Other

## 2017-07-06 DIAGNOSIS — M6281 Muscle weakness (generalized): Secondary | ICD-10-CM

## 2017-07-06 DIAGNOSIS — R293 Abnormal posture: Secondary | ICD-10-CM

## 2017-07-06 DIAGNOSIS — M25611 Stiffness of right shoulder, not elsewhere classified: Secondary | ICD-10-CM

## 2017-07-06 DIAGNOSIS — M25511 Pain in right shoulder: Secondary | ICD-10-CM | POA: Diagnosis not present

## 2017-07-06 NOTE — Therapy (Signed)
[place], [place], [address] Phone: [phone]   Fax:  [phone]  Physical Therapy Treatment  Patient Details  Name: Randy Moreno MRN: [id number] Date of Birth: 17-Jul-1945 Referring Provider: Rodell Perna, MD   Encounter Date: 07/06/2017  PT End of Session - 07/06/17 0935    Visit Number  8    Number of Visits  24    Date for PT Re-Evaluation  08/21/17    Authorization Type  UHC MCR    PT Start Time  0933    PT Stop Time  1025    PT Time Calculation (min)  52 min    Activity Tolerance  Patient tolerated treatment well;Patient limited by pain    Behavior During Therapy  [facility] for tasks assessed/performed       Past Medical History:  Diagnosis Date  . Allergy   . Gout   . Hyperlipidemia   . Hypertension     Past Surgical History:  Procedure Laterality Date  . APPENDECTOMY  1986  . CATARACT EXTRACTION      There were no vitals filed for this visit.  Subjective Assessment - 07/06/17 0937    Subjective  Anterior RT shoulder  achiness .      Currently in Pain?  Yes    Pain Score  2     Pain Location  Shoulder    Pain Orientation  Right    Pain Descriptors / Indicators  Aching    Pain Type  Surgical pain    Pain Frequency  Intermittent    Aggravating Factors   movement, ? other times    Pain Relieving Factors  rest support.          [facility] PT Assessment - 07/06/17 0001      PROM   Right Shoulder Extension  20 Degrees    Right Shoulder Flexion  120 Degrees    Right Shoulder ABduction  115 Degrees    Right Shoulder Internal Rotation  70 Degrees    Right Shoulder External Rotation  60 Degrees                   [facility] Adult PT Treatment/Exercise - 07/06/17 0001      Shoulder Exercises: Supine   Other Supine Exercises  Cane exercises Gentle started but painful  all except ER/IR soe went to hand hold ASSROM x 12 reps , issued for HEP      Shoulder Exercises: Pulleys   Flexion  3 minutes;Limitations    Flexion Limitations  to shoulder height and 10-20 degrees bove.  painful arc if goes higher.       Manual Therapy   Passive ROM  in all planes gentle and AAROM             PT Education - 07/06/17 0945    Education provided  Yes    Education Details  supine cane exer 10-12 reps 2x/day    Person(s) Educated  Patient    Methods  Explanation;Tactile cues;Verbal cues;Handout    Comprehension  Verbalized understanding;Returned demonstration       PT Short Term Goals - 07/06/17 1013      PT SHORT TERM GOAL #1   Title  He will report pain decr 20-30% .       Status  Achieved      PT SHORT TERM GOAL #2   Title  PROM within protocol limitations    Status  Achieved  PT Long Term Goals - 05/29/17 1117      PT LONG TERM GOAL #1   Title  He will be indpeendent with all HEP    Time  12    Period  Weeks    Status  New      PT LONG TERM GOAL #2   Title  He will have active ROM to be able to reach into cabinets and dress independnetly with RT arm.     Time  12    Period  Weeks    Status  Revised      PT LONG TERM GOAL #3   Title  FOTO score improved to   < 40 % limited to indicate  funcitonal improvement     Time  12    Period  Weeks    Status  New      PT LONG TERM GOAL #4   Title  He will report  performing  normal home tasks with RT arm. with 1-2/10 pain    Time  12    Period  Weeks    Status  New      PT LONG TERM GOAL #5   Title  He will return to light yard work with 1-2 max pain    Time  12    Period  Weeks    Status  New      Additional Long Term Goals   Additional Long Term Goals  Yes      PT LONG TERM GOAL #6   Title  He will return to golf if allowed by MD    Time  12    Period  Weeks    Status  New            Plan - 07/06/17 0935    Clinical Impression Statement  Cane exercises too much for Mr Lina at this point with pain soe moved to using LT hand to assist.   He will use cane for ER IR.  Cued him  to stop if has pain or at point of tightness  to protect shoulder.       PT Treatment/Interventions  Cryotherapy;Passive range of motion;Manual techniques;Patient/family education    PT Next Visit Plan  AAROM gentle to tolerance     PT Home Exercise Plan  scapula retract and depression , pendulum , hand hold AAROM     Consulted and Agree with Plan of Care  Patient       Patient will benefit from skilled therapeutic intervention in order to improve the following deficits and impairments:  Pain, Impaired UE functional use, Decreased range of motion, Decreased strength, Decreased activity tolerance, Postural dysfunction  Visit Diagnosis: Acute pain of right shoulder  Stiffness of right shoulder, not elsewhere classified  Muscle weakness (generalized)  Abnormal posture     Problem List Patient Active Problem List   Diagnosis Date Noted  . Complete tear of left rotator cuff 05/19/2017  . Peripheral neuropathy 01/17/2013  . HYPERTENSION 03/17/2007  . HEMOCCULT POSITIVE STOOL 03/17/2007  . 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 HEMORRHOIDS, INTERNAL 01/20/2001  . DIVERTICULOSIS, COLON 01/20/2001    [name]  PT 07/06/2017, 10:16 AM  [facility] [address] [place], [place], [address] Phone: [phone]   Fax:  [phone]  Name: Randy Moreno MRN: [id number] Date of Birth: 05/09/1945

## 2017-07-06 NOTE — Patient Instructions (Addendum)
supine cane ER exercises  10-12 reps 2 x/day

## 2017-07-07 ENCOUNTER — Encounter (INDEPENDENT_AMBULATORY_CARE_PROVIDER_SITE_OTHER): Payer: Self-pay | Admitting: Orthopaedic Surgery

## 2017-07-07 ENCOUNTER — Ambulatory Visit (INDEPENDENT_AMBULATORY_CARE_PROVIDER_SITE_OTHER): Payer: Medicare Other | Admitting: Orthopaedic Surgery

## 2017-07-07 VITALS — BP 126/67 | HR 78 | Temp 97.6°F | Ht 68.0 in | Wt 193.0 lb

## 2017-07-07 DIAGNOSIS — Z9889 Other specified postprocedural states: Secondary | ICD-10-CM

## 2017-07-07 NOTE — Progress Notes (Signed)
   Post-Op Visit Note   Patient: Randy Moreno           Date of Birth: October 24, 1945           MRN: [id number] Visit Date: 07/07/2017 PCP: Crist Infante, MD   Assessment & Plan:  Chief Complaint:  Chief Complaint  Patient presents with  . Right Shoulder - Pain, Routine Post Op  Patient returns for recheck.  About 7-week status post right shoulder scope with rotator cuff repair.  He is making good progress with PT.  Started second phase of therapy last week. Visit Diagnoses:  1. S/P right rotator cuff repair     Plan: Patient will continue PT protocol for range of motion and strengthening.  They have already started second phase of PT.  He can wean out of his shoulder immobilizer.  Must still be careful.  Follow-up with Dr. Lorin Mercy in 5 weeks for recheck.  Follow-Up Instructions: Return in about 5 weeks (around 08/11/2017) for with Dr Lorin Mercy.   Orders:  No orders of the defined types were placed in this encounter.  No orders of the defined types were placed in this encounter.   Imaging: No results found.  PMFS History: Patient Active Problem List   Diagnosis Date Noted  . Complete tear of left rotator cuff 05/19/2017  . Peripheral neuropathy 01/17/2013  . HYPERTENSION 03/17/2007  . HEMOCCULT POSITIVE STOOL 03/17/2007  . 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 HEMORRHOIDS, INTERNAL 01/20/2001  . DIVERTICULOSIS, COLON 01/20/2001   Past Medical History:  Diagnosis Date  . Allergy   . Gout   . Hyperlipidemia   . Hypertension     Family History  Problem Relation Age of Onset  . Colon cancer Mother 56  . Heart disease Father   . Diabetes Father   . Heart Problems Paternal Grandfather     Past Surgical History:  Procedure Laterality Date  . APPENDECTOMY  1986  . CATARACT EXTRACTION     Social History   Occupational History  . Occupation: Retired  Tobacco Use  . Smoking status: Former Smoker    Last attempt to quit: 07/22/1987    Years since quitting: 29.9  .  Smokeless tobacco: Never Used  Substance and Sexual Activity  . Alcohol use: Yes    Alcohol/week: 16.8 oz    Types: 28 Glasses of wine per week    Comment: daily  . Drug use: No  . Sexual activity: Not on file   Exam Passive flexion/abduction to about 160 degrees.  Cuff weakness as to be expected.

## 2017-07-09 ENCOUNTER — Ambulatory Visit: Payer: Medicare Other

## 2017-07-09 DIAGNOSIS — R293 Abnormal posture: Secondary | ICD-10-CM

## 2017-07-09 DIAGNOSIS — M25511 Pain in right shoulder: Secondary | ICD-10-CM | POA: Diagnosis not present

## 2017-07-09 DIAGNOSIS — M6281 Muscle weakness (generalized): Secondary | ICD-10-CM

## 2017-07-09 DIAGNOSIS — M25611 Stiffness of right shoulder, not elsewhere classified: Secondary | ICD-10-CM

## 2017-07-09 NOTE — Therapy (Signed)
[place], [place], [address] Phone: [phone]   Fax:  [phone]  Physical Therapy Treatment  Patient Details  Name: Randy Moreno MRN: [id number] Date of Birth: [birth year] Referring Provider: Rodell Perna, MD   Encounter Date: 07/09/2017  PT End of Session - 07/09/17 1025    Visit Number  9    Number of Visits  24    Date for PT Re-Evaluation  08/21/17    Authorization Type  UHC MCR    PT Start Time  1324    PT Stop Time  1055    PT Time Calculation (min)  40 min    Activity Tolerance  Patient tolerated treatment well;Patient limited by pain    Behavior During Therapy  [facility] for tasks assessed/performed       Past Medical History:  Diagnosis Date  . Allergy   . Gout   . Hyperlipidemia   . Hypertension     Past Surgical History:  Procedure Laterality Date  . APPENDECTOMY  1986  . CATARACT EXTRACTION      There were no vitals filed for this visit.  Subjective Assessment - 07/09/17 1023    Subjective  MD pleased with progress.  He reports exercises making shoulder sore. ( asked to drop to 1x/day over weekend    Currently in Pain?  Yes    Pain Score  2     Pain Location  Shoulder    Pain Orientation  Right;Anterior    Pain Descriptors / Indicators  Aching    Pain Type  Surgical pain    Pain Onset  More than a month ago    Pain Frequency  Constant    Aggravating Factors   moving arm    Pain Relieving Factors  rest                       [facility] Adult PT Treatment/Exercise - 07/09/17 0001      Manual Therapy   Passive ROM  in all planes gentle  AAROM being cautious as pain as guide .                PT Short Term Goals - 07/06/17 1013      PT SHORT TERM GOAL #1   Title  He will report pain decr 20-30% .       Status  Achieved      PT SHORT TERM GOAL #2   Title  PROM within protocol limitations    Status  Achieved        PT Long Term Goals - 07/09/17 1056       PT LONG TERM GOAL #1   Title  He will be indpendent with all HEP    Status  On-going      PT LONG TERM GOAL #2   Title  He will have active ROM to be able to reach into cabinets and dress independnetly with RT arm.     Status  On-going      PT LONG TERM GOAL #3   Title  FOTO score improved to   < 40 % limited to indicate  funcitonal improvement     Status  Unable to assess      PT LONG TERM GOAL #4   Title  He will report  performing  normal home tasks with RT arm. with 1-2/10 pain    Status  On-going      PT  LONG TERM GOAL #5   Title  He will return to light yard work with 1-2 max pain    Status  On-going      PT LONG TERM GOAL #6   Title  He will return to golf if allowed by MD    Status  On-going            Plan - 07/09/17 1026    Clinical Impression Statement  ROM contniues to be good  with mild pain.  he is using ice at home and will decr ROM to 1x/day.   We will not start isometrics for another 7-10 days to be very cautious.     PT Treatment/Interventions  Cryotherapy;Passive range of motion;Manual techniques;Patient/family education    PT Next Visit Plan  AAROM gentle to tolerance     PT Home Exercise Plan  scapula retract and depression , pendulum , hand hold AAROM     Consulted and Agree with Plan of Care  Patient       Patient will benefit from skilled therapeutic intervention in order to improve the following deficits and impairments:  Pain, Impaired UE functional use, Decreased range of motion, Decreased strength, Decreased activity tolerance, Postural dysfunction  Visit Diagnosis: Acute pain of right shoulder  Stiffness of right shoulder, not elsewhere classified  Muscle weakness (generalized)  Abnormal posture     Problem List Patient Active Problem List   Diagnosis Date Noted  . Complete tear of left rotator cuff 05/19/2017  . Peripheral neuropathy 01/17/2013  . HYPERTENSION 03/17/2007  . HEMOCCULT POSITIVE STOOL 03/17/2007  . COLONIC  POLYPS, ADENOMATOUS 01/20/2001  . HEMORRHOIDS, INTERNAL 01/20/2001  . DIVERTICULOSIS, COLON 01/20/2001    [name]  PT 07/09/2017, 10:57 AM  [facility] [address] [place], [place], [address] Phone: [phone]   Fax:  [phone]  Name: Randy Moreno MRN: [id number] Date of Birth: May 11, 1945

## 2017-07-13 ENCOUNTER — Ambulatory Visit: Payer: Medicare Other

## 2017-07-13 ENCOUNTER — Encounter: Payer: Medicare Other | Admitting: Physical Therapy

## 2017-07-13 DIAGNOSIS — M25511 Pain in right shoulder: Secondary | ICD-10-CM | POA: Diagnosis not present

## 2017-07-13 DIAGNOSIS — M6281 Muscle weakness (generalized): Secondary | ICD-10-CM

## 2017-07-13 DIAGNOSIS — M25611 Stiffness of right shoulder, not elsewhere classified: Secondary | ICD-10-CM

## 2017-07-13 NOTE — Therapy (Signed)
[place], [place], [address] Phone: [phone]   Fax:  [phone]  Physical Therapy Treatment  Patient Details  Name: Randy Moreno MRN: [id number] Date of Birth: [birth year] Referring Provider: Rodell Perna, MD   Encounter Date: 07/13/2017  PT End of Session - 07/13/17 1100    Visit Number  10    Number of Visits  24    Date for PT Re-Evaluation  08/21/17    Authorization Type  UHC MCR    PT Start Time  1100    PT Stop Time  1145    PT Time Calculation (min)  45 min    Activity Tolerance  Patient tolerated treatment well;Patient limited by pain    Behavior During Therapy  [facility] for tasks assessed/performed       Past Medical History:  Diagnosis Date  . Allergy   . Gout   . Hyperlipidemia   . Hypertension     Past Surgical History:  Procedure Laterality Date  . APPENDECTOMY  1986  . CATARACT EXTRACTION      There were no vitals filed for this visit.  Subjective Assessment - 07/13/17 1105    Subjective  Pain 2/10 . Did exercises yesterday and limited per pain.     Currently in Pain?  Yes    Pain Score  2     Pain Location  Shoulder    Pain Orientation  Right;Anterior    Pain Descriptors / Indicators  Aching    Pain Onset  More than a month ago    Aggravating Factors   moving arm    Pain Relieving Factors  rest    Multiple Pain Sites  No         [facility] PT Assessment - 07/13/17 0001      Observation/Other Assessments   Focus on Therapeutic Outcomes (FOTO)   53% improved 5 poiunts                   [facility] Adult PT Treatment/Exercise - 07/13/17 0001      Manual Therapy   Passive ROM  in all planes gentle  AAROM being cautious as pain as guide .                PT Short Term Goals - 07/06/17 1013      PT SHORT TERM GOAL #1   Title  He will report pain decr 20-30% .       Status  Achieved      PT SHORT TERM GOAL #2   Title  PROM within protocol limitations    Status  Achieved        PT Long Term Goals - 07/09/17 1056      PT LONG TERM GOAL #1   Title  He will be indpendent with all HEP    Status  On-going      PT LONG TERM GOAL #2   Title  He will have active ROM to be able to reach into cabinets and dress independnetly with RT arm.     Status  On-going      PT LONG TERM GOAL #3   Title  FOTO score improved to   < 40 % limited to indicate  funcitonal improvement     Status  Unable to assess      PT LONG TERM GOAL #4   Title  He will report  performing  normal home tasks with RT arm. with  1-2/10 pain    Status  On-going      PT LONG TERM GOAL #5   Title  He will return to light yard work with 1-2 max pain    Status  On-going      PT LONG TERM GOAL #6   Title  He will return to golf if allowed by MD    Status  On-going            Plan - 07/13/17 1100    Clinical Impression Statement  He is touchy with pain limiting ROM initially in session but eventiually range improveds to 80-90 % normal range . He is able to reach with assist to RT buttock and Lt hand only able to reach to lower back.   Cont AAROM to next week then gentle isometrics next week    PT Treatment/Interventions  Cryotherapy;Passive range of motion;Manual techniques;Patient/family education    PT Next Visit Plan  AAROM gentle to tolerance     PT Home Exercise Plan  scapula retract and depression , pendulum , hand hold AAROM     Consulted and Agree with Plan of Care  Patient       Patient will benefit from skilled therapeutic intervention in order to improve the following deficits and impairments:  Pain, Impaired UE functional use, Decreased range of motion, Decreased strength, Decreased activity tolerance, Postural dysfunction  Visit Diagnosis: Acute pain of right shoulder  Stiffness of right shoulder, not elsewhere classified  Muscle weakness (generalized)     Problem List Patient Active Problem List   Diagnosis Date Noted  . Complete tear of left  rotator cuff 05/19/2017  . Peripheral neuropathy 01/17/2013  . HYPERTENSION 03/17/2007  . HEMOCCULT POSITIVE STOOL 03/17/2007  . 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 HEMORRHOIDS, INTERNAL 01/20/2001  . DIVERTICULOSIS, COLON 01/20/2001    [name]  PT  07/13/2017, 11:40 AM  [facility] [address] [place], [place], [address] Phone: [phone]   Fax:  [phone]  Name: Randy Moreno MRN: [id number] Date of Birth: June 19, 1945

## 2017-07-14 ENCOUNTER — Ambulatory Visit (INDEPENDENT_AMBULATORY_CARE_PROVIDER_SITE_OTHER): Payer: Medicare Other | Admitting: Orthopaedic Surgery

## 2017-07-16 ENCOUNTER — Ambulatory Visit: Payer: Medicare Other | Attending: Orthopaedic Surgery

## 2017-07-16 DIAGNOSIS — M25511 Pain in right shoulder: Secondary | ICD-10-CM | POA: Diagnosis not present

## 2017-07-16 DIAGNOSIS — M25611 Stiffness of right shoulder, not elsewhere classified: Secondary | ICD-10-CM

## 2017-07-16 DIAGNOSIS — R293 Abnormal posture: Secondary | ICD-10-CM | POA: Insufficient documentation

## 2017-07-16 DIAGNOSIS — M6281 Muscle weakness (generalized): Secondary | ICD-10-CM | POA: Diagnosis present

## 2017-07-16 NOTE — Therapy (Signed)
[place], [place], [address] Phone: [phone]   Fax:  [phone]  Physical Therapy Treatment  Patient Details  Name: Randy Moreno MRN: [id number] Date of Birth: 1946/01/02 Referring Provider: Rodell Perna, MD   Encounter Date: 07/16/2017  PT End of Session - 07/16/17 1017    Visit Number  11    Number of Visits  24    Date for PT Re-Evaluation  08/21/17    Authorization Type  UHC MCR    PT Start Time  1017 kept short as pain gone and not ready for isometrics until next week    PT Stop Time  1037    PT Time Calculation (min)  20 min    Activity Tolerance  Patient tolerated treatment well;Patient limited by pain    Behavior During Therapy  [facility] for tasks assessed/performed       Past Medical History:  Diagnosis Date  . Allergy   . Gout   . Hyperlipidemia   . Hypertension     Past Surgical History:  Procedure Laterality Date  . APPENDECTOMY  1986  . CATARACT EXTRACTION      There were no vitals filed for this visit.  Subjective Assessment - 07/16/17 1043    Subjective  no pain . just soreness anterior RT shoulder  Have been reaching for glasses with RT arm. ( Asked him not to do this just yet)    Currently in Pain?  No/denies                       [facility] Adult PT Treatment/Exercise - 07/16/17 0001      Shoulder Exercises: Standing   Other Standing Exercises  wall slide assist with Lt arm x 10 reps.       Manual Therapy   Passive ROM  in all planes gentle  AAROM being cautious as pain as guide .              PT Education - 07/16/17 1043    Education provided  Yes    Education Details  wall slides 3-5 reps 6-8 x/day with pain as guide to reps and distance.     Person(s) Educated  Patient    Methods  Explanation;Demonstration;Tactile cues;Verbal cues    Comprehension  Returned demonstration;Verbalized understanding       PT Short Term Goals - 07/06/17 1013      PT SHORT TERM GOAL #1   Title  He will report pain decr 20-30% .       Status  Achieved      PT SHORT TERM GOAL #2   Title  PROM within protocol limitations    Status  Achieved        PT Long Term Goals - 07/09/17 1056      PT LONG TERM GOAL #1   Title  He will be indpendent with all HEP    Status  On-going      PT LONG TERM GOAL #2   Title  He will have active ROM to be able to reach into cabinets and dress independnetly with RT arm.     Status  On-going      PT LONG TERM GOAL #3   Title  FOTO score improved to   < 40 % limited to indicate  funcitonal improvement     Status  Unable to assess      PT LONG TERM GOAL #4  Title  He will report  performing  normal home tasks with RT arm. with 1-2/10 pain    Status  On-going      PT LONG TERM GOAL #5   Title  He will return to light yard work with 1-2 max pain    Status  On-going      PT LONG TERM GOAL #6   Title  He will return to golf if allowed by MD    Status  On-going            Plan - 07/16/17 1038    Clinical Impression Statement  His pain is much better . Because of this I felt we did not need to push ROm and AROM except to add wall slide.  we will start isometrics next week as I have been very cautious with progress and ROM is good .  His assisted movement was imrpoved with Mr Deruiter doing more of the lifting than  before.     PT Treatment/Interventions  Cryotherapy;Passive range of motion;Manual techniques;Patient/family education    PT Next Visit Plan  AAROM gentle to tolerance . isometrics next session    PT Home Exercise Plan  scapula retract and depression , pendulum , hand hold AAROM asssited , wall slide with Lt arm assist    Consulted and Agree with Plan of Care  Patient       Patient will benefit from skilled therapeutic intervention in order to improve the following deficits and impairments:  Pain, Impaired UE functional use, Decreased range of motion, Decreased strength, Decreased activity  tolerance, Postural dysfunction  Visit Diagnosis: Acute pain of right shoulder  Stiffness of right shoulder, not elsewhere classified  Muscle weakness (generalized)     Problem List Patient Active Problem List   Diagnosis Date Noted  . Complete tear of left rotator cuff 05/19/2017  . Peripheral neuropathy 01/17/2013  . HYPERTENSION 03/17/2007  . HEMOCCULT POSITIVE STOOL 03/17/2007  . 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 HEMORRHOIDS, INTERNAL 01/20/2001  . DIVERTICULOSIS, COLON 01/20/2001    [name]  PT 07/16/2017, 10:45 AM  [facility] [address] [place], [place], [address] Phone: [phone]   Fax:  [phone]  Name: Randy Moreno MRN: [id number] Date of Birth: 1945/06/30

## 2017-07-20 ENCOUNTER — Ambulatory Visit: Payer: Medicare Other

## 2017-07-20 DIAGNOSIS — M25511 Pain in right shoulder: Secondary | ICD-10-CM

## 2017-07-20 DIAGNOSIS — M6281 Muscle weakness (generalized): Secondary | ICD-10-CM

## 2017-07-20 DIAGNOSIS — M25611 Stiffness of right shoulder, not elsewhere classified: Secondary | ICD-10-CM

## 2017-07-20 NOTE — Therapy (Signed)
[place], [place], [address] Phone: [phone]   Fax:  [phone]  Physical Therapy Treatment  Patient Details  Name: Randy Moreno MRN: [id number] Date of Birth: Feb 06, 1946 Referring Provider: Rodell Perna, MD   Encounter Date: 07/20/2017  PT End of Session - 07/20/17 0920    Visit Number  12    Number of Visits  24    Date for PT Re-Evaluation  08/21/17    Authorization Type  UHC MCR    PT Start Time  0920    PT Stop Time  1000    PT Time Calculation (min)  40 min    Activity Tolerance  Patient tolerated treatment well    Behavior During Therapy  [facility] for tasks assessed/performed       Past Medical History:  Diagnosis Date  . Allergy   . Gout   . Hyperlipidemia   . Hypertension     Past Surgical History:  Procedure Laterality Date  . APPENDECTOMY  1986  . CATARACT EXTRACTION      There were no vitals filed for this visit.  Subjective Assessment - 07/20/17 0921    Subjective  Anterior shoulder aching from exer.   Wall slides 2-3x/day.    Pain Score  -- 1.5    Pain Location  Shoulder    Pain Orientation  Right;Anterior    Pain Descriptors / Indicators  Aching    Pain Type  Surgical pain    Pain Onset  More than a month ago    Pain Frequency  Intermittent    Aggravating Factors   doing exer    Pain Relieving Factors  rest                       [facility] Adult PT Treatment/Exercise - 07/20/17 0001      Shoulder Exercises: Standing   Other Standing Exercises  isometric cued to not have pain and reps with  all planes       Manual Therapy   Passive ROM  in all planes gentle  AAROM being cautious as pain as guide .              PT Education - 07/20/17 1008    Education provided  Yes    Education Details  isomterics    Person(s) Educated  Patient    Methods  Explanation;Demonstration;Tactile cues;Handout;Verbal cues    Comprehension  Returned  demonstration;Verbalized understanding       PT Short Term Goals - 07/06/17 1013      PT SHORT TERM GOAL #1   Title  He will report pain decr 20-30% .       Status  Achieved      PT SHORT TERM GOAL #2   Title  PROM within protocol limitations    Status  Achieved        PT Long Term Goals - 07/09/17 1056      PT LONG TERM GOAL #1   Title  He will be indpendent with all HEP    Status  On-going      PT LONG TERM GOAL #2   Title  He will have active ROM to be able to reach into cabinets and dress independnetly with RT arm.     Status  On-going      PT LONG TERM GOAL #3   Title  FOTO score improved to   < 40 % limited to indicate  funcitonal improvement     Status  Unable to assess      PT LONG TERM GOAL #4   Title  He will report  performing  normal home tasks with RT arm. with 1-2/10 pain    Status  On-going      PT LONG TERM GOAL #5   Title  He will return to light yard work with 1-2 max pain    Status  On-going      PT LONG TERM GOAL #6   Title  He will return to golf if allowed by MD    Status  On-going            Plan - 07/20/17 0920    Clinical Impression Statement  Initiated isometrics. no pain  Cued to not have pain at home.   Range functional and AAROM he is doing 80% or this without incre pain.   Continue with caution.     PT Treatment/Interventions  Cryotherapy;Passive range of motion;Manual techniques;Patient/family education    PT Next Visit Plan  AAROM gentle to tolerance . isometrics review    PT Home Exercise Plan  scapula retract and depression , pendulum , hand hold AAROM asssited , wall slide with Lt arm assist    Consulted and Agree with Plan of Care  Patient       Patient will benefit from skilled therapeutic intervention in order to improve the following deficits and impairments:  Pain, Impaired UE functional use, Decreased range of motion, Decreased strength, Decreased activity tolerance, Postural dysfunction  Visit Diagnosis: Acute  pain of right shoulder  Stiffness of right shoulder, not elsewhere classified  Muscle weakness (generalized)     Problem List Patient Active Problem List   Diagnosis Date Noted  . Complete tear of left rotator cuff 05/19/2017  . Peripheral neuropathy 01/17/2013  . HYPERTENSION 03/17/2007  . HEMOCCULT POSITIVE STOOL 03/17/2007  . 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 HEMORRHOIDS, INTERNAL 01/20/2001  . DIVERTICULOSIS, COLON 01/20/2001    [name] PT 07/20/2017, 10:13 AM  [facility] [address] [place], [place], [address] Phone: [phone]   Fax:  [phone]  Name: Randy Moreno MRN: [id number] Date of Birth: [birth year]

## 2017-07-20 NOTE — Patient Instructions (Signed)
Strengthening: Isometric Flexion  Using wall for resistance, press right fist into ball using light pressure. Hold ____ seconds. Repeat ____ times per set. Do ____ sets per session. Do ____ sessions per day.  SHOULDER: Abduction (Isometric)  Use wall as resistance. Press arm against pillow. Keep elbow straight. Hold ___ seconds. ___ reps per set, ___ sets per day, ___ days per week  Extension (Isometric)  Place left bent elbow and back of arm against wall. Press elbow against wall. Hold ____ seconds. Repeat ____ times. Do ____ sessions per day.  Internal Rotation (Isometric)  Place palm of right fist against door frame, with elbow bent. Press fist against door frame. Hold ____ seconds. Repeat ____ times. Do ____ sessions per day.  External Rotation (Isometric)  Place back of left fist against door frame, with elbow bent. Press fist against door frame. Hold ____ seconds. Repeat ____ times. Do ____ sessions per day.  Copyright  VHI. All rights reserved.   5-10 reps ,   5-10  sec 1-2x/day

## 2017-07-23 ENCOUNTER — Encounter: Payer: Self-pay | Admitting: Physical Therapy

## 2017-07-23 ENCOUNTER — Ambulatory Visit: Payer: Medicare Other | Admitting: Physical Therapy

## 2017-07-23 DIAGNOSIS — M25511 Pain in right shoulder: Secondary | ICD-10-CM | POA: Diagnosis not present

## 2017-07-23 DIAGNOSIS — M25611 Stiffness of right shoulder, not elsewhere classified: Secondary | ICD-10-CM

## 2017-07-23 DIAGNOSIS — M6281 Muscle weakness (generalized): Secondary | ICD-10-CM

## 2017-07-23 DIAGNOSIS — R293 Abnormal posture: Secondary | ICD-10-CM

## 2017-07-23 NOTE — Therapy (Signed)
[place], [place], [address] Phone: [phone]   Fax:  [phone]  Physical Therapy Treatment  Patient Details  Name: Randy Moreno MRN: [id number] Date of Birth: 11/02/1945 Referring Provider: Rodell Perna, MD   Encounter Date: 07/23/2017  PT End of Session - 07/23/17 0940    Visit Number  13    Number of Visits  24    Date for PT Re-Evaluation  08/21/17    Authorization Type  UHC MCR    PT Start Time  0932    PT Stop Time  1013    PT Time Calculation (min)  41 min    Activity Tolerance  Patient tolerated treatment well    Behavior During Therapy  [facility] for tasks assessed/performed       Past Medical History:  Diagnosis Date  . Allergy   . Gout   . Hyperlipidemia   . Hypertension     Past Surgical History:  Procedure Laterality Date  . APPENDECTOMY  1986  . CATARACT EXTRACTION      There were no vitals filed for this visit.  Subjective Assessment - 07/23/17 0938    Subjective  Patient rerports his shoulder is feeling good today. It gets a little sore with the exercises but overall it is doing well. He did =his exercises already this morning.     Pertinent History  he was moving rock into truck at time of shoulder issues starting    Patient Stated Goals  REturn to use RT arm normally    Currently in Pain?  Yes    Pain Score  1     Pain Location  Shoulder    Pain Orientation  Right;Anterior    Pain Descriptors / Indicators  Aching    Pain Type  Surgical pain    Pain Onset  More than a month ago    Pain Frequency  Intermittent    Aggravating Factors   doing exercises     Pain Relieving Factors  rest     Effect of Pain on Daily Activities  limited per protocol                        [facility] Adult PT Treatment/Exercise - 07/23/17 0001      Shoulder Exercises: Standing   Internal Rotation  Limitations    Internal Rotation Limitations  2x10 yellow     Extension  Limitations     Extension Limitations  2x10 yellow     Row  Limitations    Row Limitations  2x0 yellow     Other Standing Exercises  Isometrics all planes 10x 5 sec hold     Other Standing Exercises  wall lsides into flexion 2x10       Shoulder Exercises: Pulleys   Flexion  2 minutes    Scaption  2 minutes      Manual Therapy   Passive ROM  in all planes gentle  AAROM being cautious as pain as guide .              PT Education - 07/23/17 [id number]    Education provided  Yes    Education Details  reviewed technique with ther-ex     Person(s) Educated  Patient    Methods  Explanation;Demonstration;Tactile cues;Verbal cues;Handout    Comprehension  Verbalized understanding;Returned demonstration;Verbal cues required       PT Short Term Goals - 07/06/17 1013  PT SHORT TERM GOAL #1   Title  He will report pain decr 20-30% .       Status  Achieved      PT SHORT TERM GOAL #2   Title  PROM within protocol limitations    Status  Achieved        PT Long Term Goals - 07/09/17 1056      PT LONG TERM GOAL #1   Title  He will be indpendent with all HEP    Status  On-going      PT LONG TERM GOAL #2   Title  He will have active ROM to be able to reach into cabinets and dress independnetly with RT arm.     Status  On-going      PT LONG TERM GOAL #3   Title  FOTO score improved to   < 40 % limited to indicate  funcitonal improvement     Status  Unable to assess      PT LONG TERM GOAL #4   Title  He will report  performing  normal home tasks with RT arm. with 1-2/10 pain    Status  On-going      PT LONG TERM GOAL #5   Title  He will return to light yard work with 1-2 max pain    Status  On-going      PT LONG TERM GOAL #6   Title  He will return to golf if allowed by MD    Status  On-going            Plan - 07/23/17 1126    Clinical Impression Statement  Therapy iniitated scpaular strengthening exercises and a light active RTC active movement. He had no increase in pain. If  the patient reacts well to new exercises therapy will give them to him for home. His passive range is proressing well.     Clinical Presentation  Stable    Clinical Decision Making  Low    Rehab Potential  Good    PT Frequency  2x / week    PT Duration  12 weeks    PT Treatment/Interventions  Cryotherapy;Passive range of motion;Manual techniques;Patient/family education    PT Next Visit Plan  AAROM gentle to tolerance . isometrics review    PT Home Exercise Plan  scapula retract and depression , pendulum , hand hold AAROM asssited , wall slide with Lt arm assist    Consulted and Agree with Plan of Care  Patient    Family Member Consulted  spouse       Patient will benefit from skilled therapeutic intervention in order to improve the following deficits and impairments:  Pain, Impaired UE functional use, Decreased range of motion, Decreased strength, Decreased activity tolerance, Postural dysfunction  Visit Diagnosis: Acute pain of right shoulder  Stiffness of right shoulder, not elsewhere classified  Muscle weakness (generalized)  Abnormal posture     Problem List Patient Active Problem List   Diagnosis Date Noted  . Complete tear of left rotator cuff 05/19/2017  . Peripheral neuropathy 01/17/2013  . HYPERTENSION 03/17/2007  . HEMOCCULT POSITIVE STOOL 03/17/2007  . 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 HEMORRHOIDS, INTERNAL 01/20/2001  . DIVERTICULOSIS, COLON 01/20/2001    Carney Living PT DPT  07/23/2017, 11:34 AM   Cooper Render SPT  07/23/2017   During this treatment session, the therapist was present, participating in and directing the treatment.   [place]  [place], [place], [address] Phone: [phone]   Fax:  [phone]  Name: Randy Moreno MRN: [id number] Date of Birth: May 27, 1945

## 2017-07-30 ENCOUNTER — Encounter: Payer: Self-pay | Admitting: Physical Therapy

## 2017-07-30 ENCOUNTER — Ambulatory Visit: Payer: Medicare Other | Admitting: Physical Therapy

## 2017-07-30 DIAGNOSIS — M25511 Pain in right shoulder: Secondary | ICD-10-CM | POA: Diagnosis not present

## 2017-07-30 DIAGNOSIS — R293 Abnormal posture: Secondary | ICD-10-CM

## 2017-07-30 DIAGNOSIS — M25611 Stiffness of right shoulder, not elsewhere classified: Secondary | ICD-10-CM

## 2017-07-30 DIAGNOSIS — M6281 Muscle weakness (generalized): Secondary | ICD-10-CM

## 2017-07-30 NOTE — Therapy (Signed)
[place], [place], [address] Phone: [phone]   Fax:  [phone]  Physical Therapy Treatment  Patient Details  Name: Randy Moreno MRN: [id number] Date of Birth: 09-05-1945 Referring Provider: Rodell Perna, MD   Encounter Date: 07/30/2017  PT End of Session - 07/30/17 0939    Visit Number  14    Number of Visits  24    Date for PT Re-Evaluation  08/21/17    Authorization Type  UHC MCR    PT Start Time  0930    PT Stop Time  1012    PT Time Calculation (min)  42 min    Activity Tolerance  Patient tolerated treatment well    Behavior During Therapy  [facility] for tasks assessed/performed       Past Medical History:  Diagnosis Date  . Allergy   . Gout   . Hyperlipidemia   . Hypertension     Past Surgical History:  Procedure Laterality Date  . APPENDECTOMY  1986  . CATARACT EXTRACTION      There were no vitals filed for this visit.  Subjective Assessment - 07/30/17 0933    Subjective  Patient reports his shoulder has bee nsore but not anymore sore. He has been working on his exercises. He is somewhat frustrated by being sore still. Therapy advised him that he can expect some soreness as we continue to adviance strengtheing.     Patient is accompained by:  Family member    Pertinent History  he was moving rock into truck at time of shoulder issues starting    Patient Stated Goals  REturn to use RT arm normally    Currently in Pain?  Yes    Pain Score  2     Pain Location  Shoulder    Pain Orientation  Right;Anterior    Pain Descriptors / Indicators  Aching    Pain Type  Surgical pain    Pain Onset  More than a month ago    Pain Frequency  Intermittent    Aggravating Factors   doing exercises     Pain Relieving Factors  rest     Effect of Pain on Daily Activities  limited per protocol                        [facility] Adult PT Treatment/Exercise - 07/30/17 0001      Shoulder  Exercises: Supine   Other Supine Exercises  supine low range flexion 2x10 1lb       Shoulder Exercises: Sidelying   Other Sidelying Exercises  2x10 no weight       Shoulder Exercises: Standing   External Rotation  Limitations    External Rotation Limitations  2x10 yellow     Internal Rotation  Limitations    Internal Rotation Limitations  2x10 red    Extension  Limitations    Extension Limitations  2x10 red     Row  Limitations    Row Limitations  2x10 red     Other Standing Exercises  --    Other Standing Exercises  standing UE ranger 2x10       Shoulder Exercises: Pulleys   Flexion  2 minutes    Scaption  2 minutes      Manual Therapy   Passive ROM  in all planes gentle  AAROM being cautious as pain as guide .  PT Education - 07/30/17 [phone]    Education provided  Yes    Education Details  reviewed     Person(s) Educated  Patient    Methods  Explanation;Demonstration;Tactile cues;Verbal cues    Comprehension  Verbalized understanding;Returned demonstration;Verbal cues required;Tactile cues required       PT Short Term Goals - 07/06/17 1013      PT SHORT TERM GOAL #1   Title  He will report pain decr 20-30% .       Status  Achieved      PT SHORT TERM GOAL #2   Title  PROM within protocol limitations    Status  Achieved        PT Long Term Goals - 07/09/17 1056      PT LONG TERM GOAL #1   Title  He will be indpendent with all HEP    Status  On-going      PT LONG TERM GOAL #2   Title  He will have active ROM to be able to reach into cabinets and dress independnetly with RT arm.     Status  On-going      PT LONG TERM GOAL #3   Title  FOTO score improved to   < 40 % limited to indicate  funcitonal improvement     Status  Unable to assess      PT LONG TERM GOAL #4   Title  He will report  performing  normal home tasks with RT arm. with 1-2/10 pain    Status  On-going      PT LONG TERM GOAL #5   Title  He will return to light yard work  with 1-2 max pain    Status  On-going      PT LONG TERM GOAL #6   Title  He will return to golf if allowed by MD    Status  On-going            Plan - 07/30/17 1509    Clinical Impression Statement  Therapy reviewed light scpaualr and rotator ccuff strengthening with the patient. He reported no pain after treatment. He was advised to follow his symptoms at home andgo back to isometrcs at home. His range is doing well per visual insection.     Clinical Presentation  Stable    Clinical Decision Making  Low    Rehab Potential  Good    PT Frequency  2x / week    PT Duration  12 weeks    PT Treatment/Interventions  Cryotherapy;Passive range of motion;Manual techniques;Patient/family education    PT Next Visit Plan  AAROM gentle to tolerance . isometrics review    PT Home Exercise Plan  scapula retract and depression , pendulum , hand hold AAROM asssited , wall slide with Lt arm assist    Consulted and Agree with Plan of Care  Patient    Family Member Consulted  spouse       Patient will benefit from skilled therapeutic intervention in order to improve the following deficits and impairments:  Pain, Impaired UE functional use, Decreased range of motion, Decreased strength, Decreased activity tolerance, Postural dysfunction  Visit Diagnosis: Acute pain of right shoulder  Stiffness of right shoulder, not elsewhere classified  Muscle weakness (generalized)  Abnormal posture     Problem List Patient Active Problem List   Diagnosis Date Noted  . Complete tear of left rotator cuff 05/19/2017  . Peripheral neuropathy 01/17/2013  . HYPERTENSION 03/17/2007  .  HEMOCCULT POSITIVE STOOL 03/17/2007  . 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 HEMORRHOIDS, INTERNAL 01/20/2001  . DIVERTICULOSIS, COLON 01/20/2001    Carney Living PT DPT  07/30/2017, 3:17 PM  [facility] [address] [place], [place], [address] Phone:  [phone]   Fax:  [phone]  Name: Randy Moreno MRN: [id number] Date of Birth: [birth year]

## 2017-08-03 ENCOUNTER — Ambulatory Visit: Payer: Medicare Other

## 2017-08-03 DIAGNOSIS — M25511 Pain in right shoulder: Secondary | ICD-10-CM | POA: Diagnosis not present

## 2017-08-03 DIAGNOSIS — R293 Abnormal posture: Secondary | ICD-10-CM

## 2017-08-03 DIAGNOSIS — M6281 Muscle weakness (generalized): Secondary | ICD-10-CM

## 2017-08-03 DIAGNOSIS — M25611 Stiffness of right shoulder, not elsewhere classified: Secondary | ICD-10-CM

## 2017-08-03 NOTE — Therapy (Signed)
[place], [place], [address] Phone: [phone]   Fax:  [phone]  Physical Therapy Treatment  Patient Details  Name: Randy Moreno MRN: [id number] Date of Birth: [birth year] Referring Provider: Rodell Perna, MD   Encounter Date: 08/03/2017  PT End of Session - 08/03/17 1017    Visit Number  15    Number of Visits  24    Date for PT Re-Evaluation  08/21/17    Authorization Type  UHC MCR    PT Start Time  6962    PT Stop Time  1100    PT Time Calculation (min)  45 min    Activity Tolerance  Patient tolerated treatment well    Behavior During Therapy  [facility] for tasks assessed/performed       Past Medical History:  Diagnosis Date  . Allergy   . Gout   . Hyperlipidemia   . Hypertension     Past Surgical History:  Procedure Laterality Date  . APPENDECTOMY  1986  . CATARACT EXTRACTION      There were no vitals filed for this visit.  Subjective Assessment - 08/03/17 1021    Subjective  Shoulder feels good today. Did not do exer yesterday    Currently in Pain?  Yes    Pain Score  1     Pain Location  Shoulder    Pain Orientation  Right;Anterior    Pain Descriptors / Indicators  Aching    Pain Type  Surgical pain    Pain Onset  More than a month ago    Pain Frequency  Intermittent    Aggravating Factors   exercise    Pain Relieving Factors  rest    Multiple Pain Sites  -- lower back activng up , chronic                       [facility] Adult PT Treatment/Exercise - 08/03/17 0001      Shoulder Exercises: Standing   External Rotation Limitations  2x10 yellow     Internal Rotation Limitations  2x10 yel    Extension Limitations  2x10 red     Row Limitations  2x10 red       Manual Therapy   Passive ROM  in all planes gentle  AAROM being cautious as pain as guide .                PT Short Term Goals - 07/06/17 1013      PT SHORT TERM GOAL #1   Title  He will report pain  decr 20-30% .       Status  Achieved      PT SHORT TERM GOAL #2   Title  PROM within protocol limitations    Status  Achieved        PT Long Term Goals - 07/09/17 1056      PT LONG TERM GOAL #1   Title  He will be indpendent with all HEP    Status  On-going      PT LONG TERM GOAL #2   Title  He will have active ROM to be able to reach into cabinets and dress independnetly with RT arm.     Status  On-going      PT LONG TERM GOAL #3   Title  FOTO score improved to   < 40 % limited to indicate  funcitonal improvement     Status  Unable to assess      PT LONG TERM GOAL #4   Title  He will report  performing  normal home tasks with RT arm. with 1-2/10 pain    Status  On-going      PT LONG TERM GOAL #5   Title  He will return to light yard work with 1-2 max pain    Status  On-going      PT LONG TERM GOAL #6   Title  He will return to golf if allowed by MD    Status  On-going            Plan - 08/03/17 1017    PT Treatment/Interventions  Cryotherapy;Passive range of motion;Manual techniques;Patient/family education    PT Home Exercise Plan  scapula retract and depression , pendulum , hand hold AAROM asssited , wall slide with Lt arm assist    Consulted and Agree with Plan of Care  Patient       Patient will benefit from skilled therapeutic intervention in order to improve the following deficits and impairments:  Pain, Impaired UE functional use, Decreased range of motion, Decreased strength, Decreased activity tolerance, Postural dysfunction  Visit Diagnosis: Acute pain of right shoulder  Stiffness of right shoulder, not elsewhere classified  Muscle weakness (generalized)  Abnormal posture     Problem List Patient Active Problem List   Diagnosis Date Noted  . Complete tear of left rotator cuff 05/19/2017  . Peripheral neuropathy 01/17/2013  . HYPERTENSION 03/17/2007  . HEMOCCULT POSITIVE STOOL 03/17/2007  . 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  HEMORRHOIDS, INTERNAL 01/20/2001  . DIVERTICULOSIS, COLON 01/20/2001    [name]  PT 08/03/2017, 11:05 AM  [facility] [address] [place], [place], [address] Phone: [phone]   Fax:  [phone]  Name: Randy Moreno MRN: [id number] Date of Birth: 12-Apr-1945

## 2017-08-05 ENCOUNTER — Encounter: Payer: Self-pay | Admitting: Internal Medicine

## 2017-08-06 ENCOUNTER — Ambulatory Visit: Payer: Medicare Other

## 2017-08-06 DIAGNOSIS — M25511 Pain in right shoulder: Secondary | ICD-10-CM | POA: Diagnosis not present

## 2017-08-06 DIAGNOSIS — M6281 Muscle weakness (generalized): Secondary | ICD-10-CM

## 2017-08-06 DIAGNOSIS — M25611 Stiffness of right shoulder, not elsewhere classified: Secondary | ICD-10-CM

## 2017-08-06 DIAGNOSIS — R293 Abnormal posture: Secondary | ICD-10-CM

## 2017-08-06 NOTE — Therapy (Signed)
[place], [place], [address] Phone: [phone]   Fax:  [phone]  Physical Therapy Treatment  Patient Details  Name: Randy Moreno MRN: [id number] Date of Birth: 08/28/1945 Referring Provider: Rodell Perna, MD   Encounter Date: 08/06/2017  PT End of Session - 08/06/17 1017    Visit Number  16    Number of Visits  24    Date for PT Re-Evaluation  08/21/17    Authorization Type  UHC MCR    PT Start Time  1017    PT Stop Time  1100    PT Time Calculation (min)  43 min    Activity Tolerance  Patient tolerated treatment well    Behavior During Therapy  [facility] for tasks assessed/performed       Past Medical History:  Diagnosis Date  . Allergy   . Gout   . Hyperlipidemia   . Hypertension     Past Surgical History:  Procedure Laterality Date  . APPENDECTOMY  1986  . CATARACT EXTRACTION      There were no vitals filed for this visit.  Subjective Assessment - 08/06/17 1018    Subjective  2/10 pain    Pain Score  2     Pain Location  Shoulder    Pain Orientation  Right;Anterior    Pain Descriptors / Indicators  Aching    Pain Type  Surgical pain    Pain Onset  More than a month ago    Pain Frequency  Intermittent    Aggravating Factors   stretching , exer    Pain Relieving Factors  rest                       [facility] Adult PT Treatment/Exercise - 08/06/17 0001      Shoulder Exercises: Prone   Retraction  Right;10 reps    Extension  Left;12 reps 6 with 3 pounds 6 without    Horizontal ABduction 1  Right;12 reps      Shoulder Exercises: Standing   External Rotation  Right;15 reps    External Rotation Limitations  red    Internal Rotation Limitations  15 red band    Extension Limitations  15 red    Row Limitations  15 red    Other Standing Exercises  triceps x15 red      Shoulder Exercises: Pulleys   Flexion  3 minutes      Shoulder Exercises: ROM/Strengthening   UBE  (Upper Arm Bike)  L1 3 min for 2 min back             PT Education - 08/06/17 1107    Education provided  Yes    Education Details  HEP, no pain incr with exercises    Person(s) Educated  Patient    Methods  Explanation;Tactile cues;Demonstration;Verbal cues;Handout    Comprehension  Returned demonstration;Verbalized understanding       PT Short Term Goals - 07/06/17 1013      PT SHORT TERM GOAL #1   Title  He will report pain decr 20-30% .       Status  Achieved      PT SHORT TERM GOAL #2   Title  PROM within protocol limitations    Status  Achieved        PT Long Term Goals - 08/06/17 1111      PT LONG TERM GOAL #1   Title  He will  be indpendent with all HEP    Status  On-going      PT LONG TERM GOAL #2   Title  He will have active ROM to be able to reach into cabinets and dress independnetly with RT arm.     Status  On-going      PT LONG TERM GOAL #3   Title  FOTO score improved to   < 40 % limited to indicate  funcitonal improvement     Status  Unable to assess      PT LONG TERM GOAL #4   Title  He will report  performing  normal home tasks with RT arm. with 1-2/10 pain    Baseline  pain varies , not lifting    Status  On-going      PT LONG TERM GOAL #5   Title  He will return to light yard work with 1-2 max pain    Status  On-going      PT LONG TERM GOAL #6   Title  He will return to golf if allowed by MD    Baseline  not allowed    Status  On-going            Plan - 08/06/17 1018    Clinical Impression Statement  initiated more active/ AAROM exercisee with pain as limitiing factor. Light weight added but stopped if caused any pain.  Continues with anterior Shoulder pain.  Mild.      MD visit next week.  Note to MD next visit    PT Treatment/Interventions  Cryotherapy;Passive range of motion;Manual techniques;Patient/family education    PT Next Visit Plan  AAROM gentle to tolerance . isometrics review, progress without incr pain active and  light resistive exercises    PT Home Exercise Plan  scapula retract and depression , pendulum , hand hold AAROM asssited , wall slide with Lt arm assist, prone hor abduction and ext, cane standing flex/abduct/ ER ,  supine bench  press and tricep      Consulted and Agree with Plan of Care  Patient       Patient will benefit from skilled therapeutic intervention in order to improve the following deficits and impairments:  Pain, Impaired UE functional use, Decreased range of motion, Decreased strength, Decreased activity tolerance, Postural dysfunction  Visit Diagnosis: Acute pain of right shoulder  Stiffness of right shoulder, not elsewhere classified  Muscle weakness (generalized)  Abnormal posture     Problem List Patient Active Problem List   Diagnosis Date Noted  . Complete tear of left rotator cuff 05/19/2017  . Peripheral neuropathy 01/17/2013  . HYPERTENSION 03/17/2007  . HEMOCCULT POSITIVE STOOL 03/17/2007  . 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 HEMORRHOIDS, INTERNAL 01/20/2001  . DIVERTICULOSIS, COLON 01/20/2001    [name]  PT 08/06/2017, 11:13 AM  [facility] [address] [place], [place], [address] Phone: [phone]   Fax:  [phone]  Name: Randy Moreno MRN: [id number] Date of Birth: 1945-08-06

## 2017-08-06 NOTE — Patient Instructions (Signed)
Prone hor abduction  and extension , standing cane exercise flex/abduction/extension,  Supine bench press  And  tricep ext all 2x/day 10-15 reps

## 2017-08-11 ENCOUNTER — Ambulatory Visit: Payer: Medicare Other

## 2017-08-11 DIAGNOSIS — M25511 Pain in right shoulder: Secondary | ICD-10-CM | POA: Diagnosis not present

## 2017-08-11 DIAGNOSIS — R293 Abnormal posture: Secondary | ICD-10-CM

## 2017-08-11 DIAGNOSIS — M25611 Stiffness of right shoulder, not elsewhere classified: Secondary | ICD-10-CM

## 2017-08-11 DIAGNOSIS — M6281 Muscle weakness (generalized): Secondary | ICD-10-CM

## 2017-08-11 NOTE — Therapy (Signed)
[place], [place], [address] Phone: [phone]   Fax:  [phone]  Physical Therapy Treatment  Patient Details  Name: Randy Moreno MRN: [id number] Date of Birth: [birth year] Referring Provider: Rodell Perna, MD   Encounter Date: 08/11/2017  PT End of Session - 08/11/17 1010    Visit Number  17    Number of Visits  24    Date for PT Re-Evaluation  08/21/17    Authorization Type  UHC MCR    PT Start Time  1007    PT Stop Time  1055    PT Time Calculation (min)  48 min    Activity Tolerance  Patient tolerated treatment well    Behavior During Therapy  [facility] for tasks assessed/performed       Past Medical History:  Diagnosis Date  . Allergy   . Gout   . Hyperlipidemia   . Hypertension     Past Surgical History:  Procedure Laterality Date  . APPENDECTOMY  1986  . CATARACT EXTRACTION      There were no vitals filed for this visit.  Subjective Assessment - 08/11/17 1011    Subjective  1/10 today. Did the exercises over weekend. No incr pain.   (Pended)     Pain Score  1   (Pended)     Pain Location  Shoulder  (Pended)     Pain Orientation  Right;Anterior  (Pended)     Pain Descriptors / Indicators  Aching  (Pended)     Pain Type  Surgical pain  (Pended)     Pain Onset  More than a month ago  (Pended)     Pain Frequency  Intermittent  (Pended)          [facility] PT Assessment - 08/11/17 0001      AROM   Right/Left Shoulder  Right    Right Shoulder Flexion  148 Degrees    Right Shoulder ABduction  130 Degrees    Right Shoulder Internal Rotation  42 Degrees    Right Shoulder External Rotation  75 Degrees      PROM   Right Shoulder Flexion  150 Degrees    Right Shoulder Internal Rotation  50 Degrees    Right Shoulder External Rotation  85 Degrees                   [facility] Adult PT Treatment/Exercise - 08/11/17 0001      Shoulder Exercises: Prone   Retraction  Right;15 reps     Extension  Right;15 reps    Horizontal ABduction 1  Right;15 reps 2 sets one with 2 pounds      Shoulder Exercises: Standing   External Rotation  Right;15 reps    External Rotation Limitations  red    Internal Rotation  15 reps    Internal Rotation Limitations  15 red band    Flexion  Right;15 reps;Theraband    Theraband Level (Shoulder Flexion)  Level 2 (Red)    Extension Limitations  15 red    Row Limitations  15 red    Other Standing Exercises  triceps x15 red    Other Standing Exercises  begin presure into surface  with pressure to painfree toilerance. x 4 min circles and direct pressure      Shoulder Exercises: Pulleys   Flexion  3 minutes      Shoulder Exercises: ROM/Strengthening   UBE (Upper Arm Bike)  L1 5 min  PT Short Term Goals - 07/06/17 1013      PT SHORT TERM GOAL #1   Title  He will report pain decr 20-30% .       Status  Achieved      PT SHORT TERM GOAL #2   Title  PROM within protocol limitations    Status  Achieved        PT Long Term Goals - 08/11/17 1058      PT LONG TERM GOAL #1   Title  He will be indpendent with all HEP    Baseline  doing all exer issued so far    Status  On-going      PT LONG TERM GOAL #2   Title  He will have active ROM to be able to reach into cabinets and dress independnetly with RT arm.     Status  Achieved      PT LONG TERM GOAL #3   Title  FOTO score improved to   < 40 % limited to indicate  funcitonal improvement     Status  Unable to assess      PT LONG TERM GOAL #4   Title  He will report  performing  normal home tasks with RT arm. with 1-2/10 pain    Baseline  pain generally 1-2/10, still limtied with hjome tasks      PT LONG TERM GOAL #5   Title  He will return to light yard work with 1-2 max pain    Baseline  not doing yard work    Status  On-going      PT LONG TERM GOAL #6   Title  He will return to golf if allowed by MD    Baseline  not allowed    Status  On-going             Plan - 08/11/17 1010    Clinical Impression Statement  Pain under control. we are starting light strengthening.  He is doing his HEP.  We are continuing to be careful with exercise.   Progress per MD .     PT Treatment/Interventions  Cryotherapy;Passive range of motion;Manual techniques;Patient/family education    PT Next Visit Plan  progress with light strengthening exer.  ROM     PT Home Exercise Plan  scapula retract and depression , pendulum , hand hold AAROM asssited , wall slide with Lt arm assist, prone hor abduction and ext, cane standing flex/abduct/ ER ,  supine bench  press and tricep      Consulted and Agree with Plan of Care  Patient       Patient will benefit from skilled therapeutic intervention in order to improve the following deficits and impairments:  Pain, Impaired UE functional use, Decreased range of motion, Decreased strength, Decreased activity tolerance, Postural dysfunction  Visit Diagnosis: Acute pain of right shoulder  Stiffness of right shoulder, not elsewhere classified  Muscle weakness (generalized)  Abnormal posture     Problem List Patient Active Problem List   Diagnosis Date Noted  . Complete tear of left rotator cuff 05/19/2017  . Peripheral neuropathy 01/17/2013  . HYPERTENSION 03/17/2007  . HEMOCCULT POSITIVE STOOL 03/17/2007  . 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 HEMORRHOIDS, INTERNAL 01/20/2001  . DIVERTICULOSIS, COLON 01/20/2001    [name]  PT 08/11/2017, 11:03 AM  [facility] [address] [place], [place], [address] Phone: [phone]   Fax:  [phone]  Name: Randy Moreno  MRN: [id number] Date of Birth: [birth year]

## 2017-08-12 ENCOUNTER — Ambulatory Visit (INDEPENDENT_AMBULATORY_CARE_PROVIDER_SITE_OTHER): Payer: Medicare Other | Admitting: Surgery

## 2017-08-12 VITALS — BP 128/70 | HR 70 | Ht 68.0 in | Wt 195.0 lb

## 2017-08-12 DIAGNOSIS — Z9889 Other specified postprocedural states: Secondary | ICD-10-CM

## 2017-08-12 NOTE — Progress Notes (Signed)
8 72-year-old white male who is about 3 months status post right shoulder rotator cuff repair returns.  States that he is doing well and pleased with his progress up to this point.  Continues to work with formal PT twice weekly.   Exam Pleasant white male alert and oriented in no acute distress.  Right shoulder he does have very good range of motion all planes.  Does continue to have some residual cuff weakness as to be expected at this point postop.  Negative drop arm.  Neurovascular intact.  Plan Patient will continue to work with formal PT and do home excise program to focus more on strengthening.  No aggressive activity.  No heavy lifting.  Will not return back to golf until released by Dr. Lorin Mercy.  Follow-up with Dr. Lorin Mercy in 6 weeks for recheck.  Return sooner if needed.

## 2017-08-13 ENCOUNTER — Ambulatory Visit: Payer: Medicare Other

## 2017-08-13 DIAGNOSIS — M25511 Pain in right shoulder: Secondary | ICD-10-CM | POA: Diagnosis not present

## 2017-08-13 DIAGNOSIS — R293 Abnormal posture: Secondary | ICD-10-CM

## 2017-08-13 DIAGNOSIS — M6281 Muscle weakness (generalized): Secondary | ICD-10-CM

## 2017-08-13 DIAGNOSIS — M25611 Stiffness of right shoulder, not elsewhere classified: Secondary | ICD-10-CM

## 2017-08-13 NOTE — Therapy (Signed)
[place], [place], [address] Phone: [phone]   Fax:  [phone]  Physical Therapy Treatment  Patient Details  Name: Randy Moreno MRN: [id number] Date of Birth: 09/17/1945 Referring Provider: Rodell Perna, MD   Encounter Date: 08/13/2017  PT End of Session - 08/13/17 1016    Visit Number  18    Number of Visits  30    Date for PT Re-Evaluation  09/25/17    Authorization Type  [employer]HC MCR    Authorization Time Period  KX at 15  progress at visit 20    PT Start Time  1005    PT Stop Time  1045    PT Time Calculation (min)  40 min    Activity Tolerance  Patient tolerated treatment well    Behavior During Therapy  [facility] for tasks assessed/performed       Past Medical History:  Diagnosis Date  . Allergy   . Gout   . Hyperlipidemia   . Hypertension     Past Surgical History:  Procedure Laterality Date  . APPENDECTOMY  1986  . CATARACT EXTRACTION      There were no vitals filed for this visit.  Subjective Assessment - 08/13/17 1015    Subjective  PA felt I was doing well and will return in 6 weeks. Ok to strengthen but no heavy lifting and to be careful    Currently in Pain?  Yes    Pain Score  1     Pain Location  Shoulder    Pain Orientation  Right;Anterior    Pain Descriptors / Indicators  Aching    Pain Type  Surgical pain    Pain Onset  More than a month ago    Pain Frequency  Intermittent                       [facility] Adult PT Treatment/Exercise - 08/13/17 0001      Shoulder Exercises: Supine   Other Supine Exercises  bench press 3 pounds x 15  2 pounds x 10 then,  hor adduction /abduct x 12 rpes 1 pound, then LT sidelye abduction      Shoulder Exercises: Sidelying   External Rotation  Right;Limitations    External Rotation Limitations  with towel.  1 pouind x 5 2 pounds x5 3pounds x 5 2 pounds x 10    ABduction  Right;10 reps 2 sets    Other Sidelying Exercises  Hor  abduction x 8 then x 8 with 1 pound      Shoulder Exercises: Standing   External Rotation  Right;15 reps    External Rotation Limitations  red    Internal Rotation  15 reps    Internal Rotation Limitations  15 red band    Flexion  Right;15 reps;Theraband    Theraband Level (Shoulder Flexion)  Level 2 (Red)    Extension Limitations  15 red    Other Standing Exercises  triceps x15  3 pounds     Other Standing Exercises  counter push ups short x 15, weight shift from hand to hand x 15 each      Shoulder Exercises: Pulleys   Flexion  2 minutes      Shoulder Exercises: ROM/Strengthening   UBE (Upper Arm Bike)  L2  8 min 4 min each way                PT Short Term Goals -  07/06/17 1013      PT SHORT TERM GOAL #1   Title  He will report pain decr 20-30% .       Status  Achieved      PT SHORT TERM GOAL #2   Title  PROM within protocol limitations    Status  Achieved        PT Long Term Goals - 08/11/17 1058      PT LONG TERM GOAL #1   Title  He will be indpendent with all HEP    Baseline  doing all exer issued so far    Status  On-going      PT LONG TERM GOAL #2   Title  He will have active ROM to be able to reach into cabinets and dress independnetly with RT arm.     Status  Achieved      PT LONG TERM GOAL #3   Title  FOTO score improved to   < 40 % limited to indicate  funcitonal improvement     Status  Unable to assess      PT LONG TERM GOAL #4   Title  He will report  performing  normal home tasks with RT arm. with 1-2/10 pain    Baseline  pain generally 1-2/10, still limtied with hjome tasks      PT LONG TERM GOAL #5   Title  He will return to light yard work with 1-2 max pain    Baseline  not doing yard work    Status  On-going      PT LONG TERM GOAL #6   Title  He will return to golf if allowed by MD    Baseline  not allowed    Status  On-going            Plan - 08/13/17 1006    PT Treatment/Interventions  Cryotherapy;Passive range of  motion;Manual techniques;Patient/family education    PT Next Visit Plan  progress with light strengthening exer.  ROM     PT Home Exercise Plan  scapula retract and depression , pendulum , hand hold AAROM asssited , wall slide with Lt arm assist, prone hor abduction and ext, cane standing flex/abduct/ ER ,  supine bench  press and tricep      Consulted and Agree with Plan of Care  Patient       Patient will benefit from skilled therapeutic intervention in order to improve the following deficits and impairments:  Pain, Impaired UE functional use, Decreased range of motion, Decreased strength, Decreased activity tolerance, Postural dysfunction  Visit Diagnosis: Acute pain of right shoulder  Stiffness of right shoulder, not elsewhere classified  Muscle weakness (generalized)  Abnormal posture     Problem List Patient Active Problem List   Diagnosis Date Noted  . Complete tear of left rotator cuff 05/19/2017  . Peripheral neuropathy 01/17/2013  . HYPERTENSION 03/17/2007  . HEMOCCULT POSITIVE STOOL 03/17/2007  . 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 HEMORRHOIDS, INTERNAL 01/20/2001  . DIVERTICULOSIS, COLON 01/20/2001    [name]  PT 08/13/2017, 10:49 AM  [facility] [address] [place], [place], [address] Phone: [phone]   Fax:  [phone]  Name: Randy Moreno MRN: [id number] Date of Birth: 01/11/1946

## 2017-08-17 ENCOUNTER — Encounter: Payer: Self-pay | Admitting: Internal Medicine

## 2017-08-17 ENCOUNTER — Ambulatory Visit: Payer: Medicare Other | Attending: Orthopaedic Surgery

## 2017-08-17 DIAGNOSIS — M25611 Stiffness of right shoulder, not elsewhere classified: Secondary | ICD-10-CM | POA: Diagnosis present

## 2017-08-17 DIAGNOSIS — M25511 Pain in right shoulder: Secondary | ICD-10-CM

## 2017-08-17 DIAGNOSIS — M6281 Muscle weakness (generalized): Secondary | ICD-10-CM | POA: Diagnosis present

## 2017-08-17 DIAGNOSIS — R293 Abnormal posture: Secondary | ICD-10-CM | POA: Diagnosis present

## 2017-08-17 NOTE — Therapy (Signed)
[place], [place], [address] Phone: [phone]   Fax:  [phone]  Physical Therapy Treatment  Patient Details  Name: Randy Moreno MRN: [id number] Date of Birth: 1946/02/01 Referring Provider: Rodell Perna, MD   Encounter Date: 08/17/2017  PT End of Session - 08/17/17 0929    Visit Number  19    Number of Visits  30    Date for PT Re-Evaluation  09/25/17    Authorization Type  UHC MCR    Authorization Time Period  KX at 15  progress at visit 20    PT Start Time  0929    PT Stop Time  1010    PT Time Calculation (min)  41 min    Activity Tolerance  Patient tolerated treatment well    Behavior During Therapy  [facility] for tasks assessed/performed       Past Medical History:  Diagnosis Date  . Allergy   . Gout   . Hyperlipidemia   . Hypertension     Past Surgical History:  Procedure Laterality Date  . APPENDECTOMY  1986  . CATARACT EXTRACTION      There were no vitals filed for this visit.  Subjective Assessment - 08/17/17 0935    Subjective  woke with incr sporeness today. No specific reason    Pain Score  3     Pain Location  Shoulder    Pain Orientation  Right;Anterior    Pain Descriptors / Indicators  Aching    Pain Type  Surgical pain    Pain Onset  More than a month ago    Pain Frequency  Intermittent                       [facility] Adult PT Treatment/Exercise - 08/17/17 0001      Elbow Exercises   Elbow Flexion  Right;20 reps;Other reps (comment) 5 pounds      Shoulder Exercises: Supine   Horizontal ABduction  Right;10 reps;Weights    Horizontal ABduction Weight (lbs)  1    Flexion  Right;10 reps;Weights    Shoulder Flexion Weight (lbs)  1    Other Supine Exercises  bench press 5 pounds x 20  2 pounds x 10 then,  hor adduction /abduct x 12 rpes 1 pound, then LT sidelye abduction    Other Supine Exercises  stablization with 2 pounds x 20 4 wayt      Shoulder  Exercises: Seated   Other Seated Exercises  assisted to 90 degrees then holding at end range x10 flex and abduct      Shoulder Exercises: Prone   Extension  Right;15 reps;Weights    Extension Weight (lbs)  3    Horizontal ABduction 1  Right;15 reps;Weights    Horizontal ABduction 1 Weight (lbs)  2    Other Prone Exercises  row 3 pounds x 20    Other Prone Exercises  assisted to hold end range hor abduction s 1 reps ( mild lag      Shoulder Exercises: Sidelying   Other Sidelying Exercises  3 pounds row x15      Shoulder Exercises: Standing   External Rotation  Right;20 reps    External Rotation Limitations  red    Internal Rotation  Right;20 reps    Internal Rotation Limitations  red band    Flexion  Right;20 reps    Theraband Level (Shoulder Flexion)  Level 2 (Red)  Extension Limitations  20 reps red    Other Standing Exercises  triceps x15  4 pounds     Other Standing Exercises  counter push ups short x 20, weight shift from hand to hand x 15 each      Shoulder Exercises: ROM/Strengthening   UBE (Upper Arm Bike)  L2 3 min for , 3 back      Manual Therapy   Passive ROM  Gentle ROM ER , hor abduction ,int rotation.                PT Short Term Goals - 07/06/17 1013      PT SHORT TERM GOAL #1   Title  He will report pain decr 20-30% .       Status  Achieved      PT SHORT TERM GOAL #2   Title  PROM within protocol limitations    Status  Achieved        PT Long Term Goals - 08/11/17 1058      PT LONG TERM GOAL #1   Title  He will be indpendent with all HEP    Baseline  doing all exer issued so far    Status  On-going      PT LONG TERM GOAL #2   Title  He will have active ROM to be able to reach into cabinets and dress independnetly with RT arm.     Status  Achieved      PT LONG TERM GOAL #3   Title  FOTO score improved to   < 40 % limited to indicate  funcitonal improvement     Status  Unable to assess      PT LONG TERM GOAL #4   Title  He will  report  performing  normal home tasks with RT arm. with 1-2/10 pain    Baseline  pain generally 1-2/10, still limtied with hjome tasks      PT LONG TERM GOAL #5   Title  He will return to light yard work with 1-2 max pain    Baseline  not doing yard work    Status  On-going      PT LONG TERM GOAL #6   Title  He will return to golf if allowed by MD    Baseline  not allowed    Status  On-going            Plan - 08/17/17 0929    Clinical Impression Statement  Pain continues to be controlled. No incr pain post last session.  No incr pain today. declined modalities. still with 5-10 degrees lag at end range all directions.     PT Treatment/Interventions  Cryotherapy;Passive range of motion;Manual techniques;Patient/family education    PT Next Visit Plan  progress with light strengthening exer.  ROM     PT Home Exercise Plan  scapula retract and depression , pendulum , hand hold AAROM asssited , wall slide with Lt arm assist, prone hor abduction and ext, cane standing flex/abduct/ ER ,  supine bench  press and tricep      Consulted and Agree with Plan of Care  Patient       Patient will benefit from skilled therapeutic intervention in order to improve the following deficits and impairments:  Pain, Impaired UE functional use, Decreased range of motion, Decreased strength, Decreased activity tolerance, Postural dysfunction  Visit Diagnosis: Acute pain of right shoulder  Stiffness of right shoulder, not elsewhere classified  Muscle  weakness (generalized)  Abnormal posture     Problem List Patient Active Problem List   Diagnosis Date Noted  . Complete tear of left rotator cuff 05/19/2017  . Peripheral neuropathy 01/17/2013  . HYPERTENSION 03/17/2007  . HEMOCCULT POSITIVE STOOL 03/17/2007  . 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 HEMORRHOIDS, INTERNAL 01/20/2001  . DIVERTICULOSIS, COLON 01/20/2001    [name]  PT 08/17/2017, 10:11 AM  [facility] [address] [place], [place], [address] Phone: [phone]   Fax:  [phone]  Name: Randy Moreno MRN: [id number] Date of Birth: [birth year]

## 2017-08-20 ENCOUNTER — Ambulatory Visit: Payer: Medicare Other

## 2017-08-20 DIAGNOSIS — R293 Abnormal posture: Secondary | ICD-10-CM

## 2017-08-20 DIAGNOSIS — M25611 Stiffness of right shoulder, not elsewhere classified: Secondary | ICD-10-CM

## 2017-08-20 DIAGNOSIS — M25511 Pain in right shoulder: Secondary | ICD-10-CM

## 2017-08-20 DIAGNOSIS — M6281 Muscle weakness (generalized): Secondary | ICD-10-CM

## 2017-08-20 NOTE — Therapy (Signed)
[place], [place], [address] Phone: [phone]   Fax:  [phone]  Physical Therapy Treatment Progress Note Reporting Period 07/16/17 to 08/20/17  See note below for Objective Data and Assessment of Progress/Goals.      Patient Details  Name: Randy Moreno MRN: [id number] Date of Birth: 10-Mar-1946 Referring Provider: Rodell Perna, MD   Encounter Date: 08/20/2017  PT End of Session - 08/20/17 1104    Visit Number  20    Number of Visits  30    Date for PT Re-Evaluation  09/25/17    Authorization Type  UHC MCR    Authorization Time Period  KX at 15  progress at visit 20 ( done 08/20/17), then 30 visits    PT Start Time  1102    PT Stop Time  1145    PT Time Calculation (min)  43 min    Activity Tolerance  Patient tolerated treatment well    Behavior During Therapy  [facility] for tasks assessed/performed       Past Medical History:  Diagnosis Date  . Allergy   . Gout   . Hyperlipidemia   . Hypertension     Past Surgical History:  Procedure Laterality Date  . APPENDECTOMY  1986  . CATARACT EXTRACTION      There were no vitals filed for this visit.  Subjective Assessment - 08/20/17 1110    Subjective  Sore from workout but good sore lasted next day but fine now    Pain Score  2     Pain Location  Shoulder    Pain Orientation  Right;Anterior    Pain Descriptors / Indicators  Aching    Pain Type  Surgical pain    Pain Onset  More than a month ago    Pain Frequency  Intermittent    Aggravating Factors   stretch /exerc    Pain Relieving Factors  rest         [facility] PT Assessment - 08/20/17 0001      Observation/Other Assessments   Focus on Therapeutic Outcomes (FOTO)   41% limited , improved 12 points                   [facility] Adult PT Treatment/Exercise - 08/20/17 0001      Shoulder Exercises: Supine   Other Supine Exercises  stablization with 3 pounds x 20 4 wayt      Shoulder  Exercises: Standing   External Rotation  Right;20 reps    External Rotation Limitations  green    Internal Rotation  Right;20 reps    Internal Rotation Limitations  green    Flexion  Right;20 reps    Theraband Level (Shoulder Flexion)  Level 3 (Green)    Extension  Right;20 reps    Extension Limitations  green    Other Standing Exercises  triceps x 20  5 pounds       Shoulder Exercises: ROM/Strengthening   UBE (Upper Arm Bike)  L2.5  3 min for , 3 back      Prone with 3 pounds ext and hor abduction 3 pounds 2x10 and worked iaometric hold for [employer] and ER in prone         PT Short Term Goals - 07/06/17 1013      PT SHORT TERM GOAL #1   Title  He will report pain decr 20-30% .       Status  Achieved  PT SHORT TERM GOAL #2   Title  PROM within protocol limitations    Status  Achieved        PT Long Term Goals - 08/20/17 1146      PT LONG TERM GOAL #1   Title  He will be indpendent with all HEP    Status  On-going      PT LONG TERM GOAL #2   Title  He will have active ROM to be able to reach into cabinets and dress independnetly with RT arm.     Status  Achieved      PT LONG TERM GOAL #3   Title  FOTO score improved to   < 40 % limited to indicate  funcitonal improvement     Baseline  41% so on cusp of making goal    Status  On-going      PT LONG TERM GOAL #4   Title  He will report  performing  normal home tasks with RT arm. with 1-2/10 pain    Baseline  pain generally 1-2/10, still limtied with home lifting  tasks      PT LONG TERM GOAL #5   Title  He will return to light yard work with 1-2 max pain    Status  On-going      PT LONG TERM GOAL #6   Title  He will return to golf if allowed by MD    Baseline  not allowed    Status  On-going            Plan - 08/20/17 1144    Clinical Impression Statement  FOTO score indicates functional improvement but Mr Lenn reports doing more instinctive activity without pain and  post PT only a good  soreness like geting a good workout    PT Treatment/Interventions  Cryotherapy;Passive range of motion;Manual techniques;Patient/family education    PT Next Visit Plan  progress with light strengthening exer.  ROM Add green band if notr ill effects from today's session.     PT Home Exercise Plan  scapula retract and depression , pendulum , hand hold AAROM asssited , wall slide with Lt arm assist, prone hor abduction and ext, cane standing flex/abduct/ ER ,  supine bench  press and tricep      Consulted and Agree with Plan of Care  Patient       Patient will benefit from skilled therapeutic intervention in order to improve the following deficits and impairments:  Pain, Impaired UE functional use, Decreased range of motion, Decreased strength, Decreased activity tolerance, Postural dysfunction  Visit Diagnosis: Acute pain of right shoulder  Stiffness of right shoulder, not elsewhere classified  Muscle weakness (generalized)  Abnormal posture     Problem List Patient Active Problem List   Diagnosis Date Noted  . Complete tear of left rotator cuff 05/19/2017  . Peripheral neuropathy 01/17/2013  . HYPERTENSION 03/17/2007  . HEMOCCULT POSITIVE STOOL 03/17/2007  . 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 HEMORRHOIDS, INTERNAL 01/20/2001  . DIVERTICULOSIS, COLON 01/20/2001    [name]  PT 08/20/2017, 11:47 AM  [facility] [address] [place], [place], [address] Phone: [phone]   Fax:  [phone]  Name: Randy Moreno MRN: [id number] Date of Birth: 05-Jan-1946

## 2017-08-24 ENCOUNTER — Ambulatory Visit: Payer: Medicare Other

## 2017-08-24 DIAGNOSIS — M25511 Pain in right shoulder: Secondary | ICD-10-CM

## 2017-08-24 DIAGNOSIS — M6281 Muscle weakness (generalized): Secondary | ICD-10-CM

## 2017-08-24 DIAGNOSIS — R293 Abnormal posture: Secondary | ICD-10-CM

## 2017-08-24 DIAGNOSIS — M25611 Stiffness of right shoulder, not elsewhere classified: Secondary | ICD-10-CM

## 2017-08-24 NOTE — Therapy (Signed)
[place], [place], [address] Phone: [phone]   Fax:  [phone]  Physical Therapy Treatment  Patient Details  Name: Randy Moreno MRN: [id number] Date of Birth: [birth year] Referring Provider: Rodell Perna, MD   Encounter Date: 08/24/2017  PT End of Session - 08/24/17 0928    Visit Number  21    Number of Visits  30    Date for PT Re-Evaluation  09/25/17    Authorization Type  UHC MCR    Authorization Time Period  KX at 15  progress at visit 30 ( 20 done 08/20/17),     PT Start Time  0930    PT Stop Time  1010    PT Time Calculation (min)  40 min    Activity Tolerance  Patient tolerated treatment well    Behavior During Therapy  [facility] for tasks assessed/performed       Past Medical History:  Diagnosis Date  . Allergy   . Gout   . Hyperlipidemia   . Hypertension     Past Surgical History:  Procedure Laterality Date  . APPENDECTOMY  1986  . CATARACT EXTRACTION      There were no vitals filed for this visit.  Subjective Assessment - 08/24/17 0949    Subjective  No complaints. very mild soreness.    Pain Score  1     Pain Location  Shoulder    Pain Orientation  Right    Pain Descriptors / Indicators  Aching    Pain Type  Surgical pain    Pain Onset  More than a month ago    Pain Frequency  Intermittent                       [facility] Adult PT Treatment/Exercise - 08/24/17 0001      Shoulder Exercises: Supine   Other Supine Exercises  hor abduct /adduct x 15 2 pounds then 3 pounds x 20 triceps      Shoulder Exercises: Prone   Extension  Right;15 reps    Extension Weight (lbs)  3    Horizontal ABduction 1  Right;15 reps;Weights    Horizontal ABduction 1 Weight (lbs)  3    Other Prone Exercises  assisted to hold end range hor abduction s 1 reps ( mild lag      Shoulder Exercises: Sidelying   External Rotation  Right;10 reps;Weights    External Rotation Weight (lbs)  2    External Rotation Limitations  then 8 reps then 6 reps    ABduction  AROM;Right;15 reps cued to come to vertical      Shoulder Exercises: Standing   Other Standing Exercises  bicep curls 6 pounds x 20 then  green band 4 way [place] x 20       Shoulder Exercises: ROM/Strengthening   UBE (Upper Arm Bike)  L2.5  4 min for , 4 back      Manual Therapy   Passive ROM  Gentle ER and abduction ( ER to 90 degrees abduction  to 90 degrees with er                PT Short Term Goals - 07/06/17 1013      PT SHORT TERM GOAL #1   Title  He will report pain decr 20-30% .       Status  Achieved      PT SHORT TERM GOAL #2   Title  PROM within protocol limitations    Status  Achieved        PT Long Term Goals - 08/20/17 1146      PT LONG TERM GOAL #1   Title  He will be indpendent with all HEP    Status  On-going      PT LONG TERM GOAL #2   Title  He will have active ROM to be able to reach into cabinets and dress independnetly with RT arm.     Status  Achieved      PT LONG TERM GOAL #3   Title  FOTO score improved to   < 40 % limited to indicate  funcitonal improvement     Baseline  41% so on cusp of making goal    Status  On-going      PT LONG TERM GOAL #4   Title  He will report  performing  normal home tasks with RT arm. with 1-2/10 pain    Baseline  pain generally 1-2/10, still limtied with home lifting  tasks      PT LONG TERM GOAL #5   Title  He will return to light yard work with 1-2 max pain    Status  On-going      PT LONG TERM GOAL #6   Title  He will return to golf if allowed by MD    Baseline  not allowed    Status  On-going            Plan - 08/24/17 0929    Clinical Impression Statement  Muscle soreness but declined cold or heat . Progressing with strength but abduction continues to be weakess so will work mor e  on this slowly as tolerated    PT Treatment/Interventions  Cryotherapy;Passive range of motion;Manual techniques;Patient/family education     PT Next Visit Plan  progress  light strengthening exer.      PT Home Exercise Plan  scapula retract and depression , pendulum , hand hold AAROM asssited , wall slide with Lt arm assist, prone hor abduction and ext, cane standing flex/abduct/ ER ,  supine bench  press and tricep      Consulted and Agree with Plan of Care  Patient       Patient will benefit from skilled therapeutic intervention in order to improve the following deficits and impairments:  Pain, Impaired UE functional use, Decreased range of motion, Decreased strength, Decreased activity tolerance, Postural dysfunction  Visit Diagnosis: Acute pain of right shoulder  Stiffness of right shoulder, not elsewhere classified  Muscle weakness (generalized)  Abnormal posture     Problem List Patient Active Problem List   Diagnosis Date Noted  . Complete tear of left rotator cuff 05/19/2017  . Peripheral neuropathy 01/17/2013  . HYPERTENSION 03/17/2007  . HEMOCCULT POSITIVE STOOL 03/17/2007  . 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 HEMORRHOIDS, INTERNAL 01/20/2001  . DIVERTICULOSIS, COLON 01/20/2001    Randy Moreno  PT 08/24/2017, 10:15 AM  [facility] [address] [place], [place], [address] Phone: [phone]   Fax:  [phone]  Name: Randy Moreno MRN: [id number] Date of Birth: [birth year]

## 2017-08-27 ENCOUNTER — Ambulatory Visit: Payer: Medicare Other

## 2017-08-27 DIAGNOSIS — M25511 Pain in right shoulder: Secondary | ICD-10-CM | POA: Diagnosis not present

## 2017-08-27 DIAGNOSIS — M25611 Stiffness of right shoulder, not elsewhere classified: Secondary | ICD-10-CM

## 2017-08-27 DIAGNOSIS — R293 Abnormal posture: Secondary | ICD-10-CM

## 2017-08-27 DIAGNOSIS — M6281 Muscle weakness (generalized): Secondary | ICD-10-CM

## 2017-08-27 NOTE — Therapy (Signed)
[name], [place], [address] Phone: [phone]   Fax:  [phone]  Physical Therapy Treatment  Patient Details  Name: Randy Moreno MRN: [id number] Date of Birth: 07/17/1945 Referring Provider: Rodell Perna, MD   Encounter Date: 08/27/2017  PT End of Session - 08/27/17 0933    Visit Number  22    Number of Visits  30    Date for PT Re-Evaluation  09/25/17    Authorization Type  [employer] MCR    Authorization Time Period  KX at 15  progress at visit 30 ( 20 done 08/20/17),     PT Start Time  0930    PT Stop Time  1012    PT Time Calculation (min)  42 min    Activity Tolerance  Patient tolerated treatment well    Behavior During Therapy  [facility] for tasks assessed/performed       Past Medical History:  Diagnosis Date  . Allergy   . Gout   . Hyperlipidemia   . Hypertension     Past Surgical History:  Procedure Laterality Date  . APPENDECTOMY  1986  . CATARACT EXTRACTION      There were no vitals filed for this visit.  Subjective Assessment - 08/27/17 0937    Subjective  No pain , sore mild.    Currently in Pain?  Yes    Pain Score  1     Pain Location  Shoulder    Pain Orientation  Right    Pain Descriptors / Indicators  Aching    Pain Type  Surgical pain                       [facility] Adult PT Treatment/Exercise - 08/27/17 0001      Shoulder Exercises: Prone   Other Prone Exercises  isometric end range holds ER and hor abduction x10-12 reps 5 se or more      Shoulder Exercises: Sidelying   External Rotation  Right;10 reps;Weights    External Rotation Weight (lbs)  3    External Rotation Limitations  2 sets    ABduction  AROM;Right;15 reps then 2x10 1 pound      Shoulder Exercises: Standing   External Rotation Limitations  25 reps green    Internal Rotation Limitations  25 reps green    Theraband Level (Shoulder Flexion)  Level 3 (Green) 25 reps    Extension Limitations  25  reps green    Other Standing Exercises  bicep curls 7 pounds x 20 then  green band 4 way rockwood x 20       Shoulder Exercises: ROM/Strengthening   UBE (Upper Arm Bike)  L2.5  4 min for , 4 back               PT Short Term Goals - 07/06/17 1013      PT SHORT TERM GOAL #1   Title  He will report pain decr 20-30% .       Status  Achieved      PT SHORT TERM GOAL #2   Title  PROM within protocol limitations    Status  Achieved        PT Long Term Goals - 08/27/17 1011      PT LONG TERM GOAL #1   Title  He will be indpendent with all HEP    Status  On-going      PT LONG TERM GOAL #  2   Title  He will have active ROM to be able to reach into cabinets and dress independnetly with RT arm.     Status  Achieved      PT LONG TERM GOAL #3   Title  FOTO score improved to   < 40 % limited to indicate  funcitonal improvement     Status  Unable to assess      PT LONG TERM GOAL #4   Title  He will report  performing  normal home tasks with RT arm. with 1-2/10 pain    Baseline  reports using arm more    Status  On-going      PT LONG TERM GOAL #5   Title  He will return to light yard work with 1-2 max pain    Baseline  not doing yard work    Status  On-going      PT LONG TERM GOAL #6   Title  He will return to golf if allowed by MD    Baseline  not allowed    Status  On-going            Plan - 08/27/17 1010    Clinical Impression Statement  Improved abduction strength in sidelye, tolerated incr reps with banc.  Continue with lag with prone ER and hor abduction  . Progressing    PT Treatment/Interventions  Cryotherapy;Passive range of motion;Manual techniques;Patient/family education    PT Next Visit Plan  progress  light strengthening exer.      Consulted and Agree with Plan of Care  Patient       Patient will benefit from skilled therapeutic intervention in order to improve the following deficits and impairments:  Pain, Impaired UE functional use, Decreased  range of motion, Decreased strength, Decreased activity tolerance, Postural dysfunction  Visit Diagnosis: Acute pain of right shoulder  Stiffness of right shoulder, not elsewhere classified  Muscle weakness (generalized)  Abnormal posture     Problem List Patient Active Problem List   Diagnosis Date Noted  . Complete tear of left rotator cuff 05/19/2017  . Peripheral neuropathy 01/17/2013  . HYPERTENSION 03/17/2007  . HEMOCCULT POSITIVE STOOL 03/17/2007  . 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 HEMORRHOIDS, INTERNAL 01/20/2001  . DIVERTICULOSIS, COLON 01/20/2001    [name]  PT 08/27/2017, 10:13 AM  [facility] [address] [place], [place], [address] Phone: [phone]   Fax:  [phone]  Name: Randy Moreno MRN: [id number] Date of Birth: 09/12/1945

## 2017-08-31 ENCOUNTER — Ambulatory Visit: Payer: Medicare Other

## 2017-08-31 DIAGNOSIS — M25511 Pain in right shoulder: Secondary | ICD-10-CM | POA: Diagnosis not present

## 2017-08-31 DIAGNOSIS — M6281 Muscle weakness (generalized): Secondary | ICD-10-CM

## 2017-08-31 DIAGNOSIS — M25611 Stiffness of right shoulder, not elsewhere classified: Secondary | ICD-10-CM

## 2017-08-31 DIAGNOSIS — R293 Abnormal posture: Secondary | ICD-10-CM

## 2017-08-31 NOTE — Patient Instructions (Signed)
Issued behind back IR stretch  2-3 reps 30-60 sec 2x/day cued to only pull to first tension to start

## 2017-08-31 NOTE — Therapy (Signed)
[place], [place], [address] Phone: [phone]   Fax:  [phone]  Physical Therapy Treatment  Patient Details  Name: Randy Moreno MRN: [id number] Date of Birth: [birth year] Referring Provider: Rodell Perna, MD   Encounter Date: 08/31/2017  PT End of Session - 08/31/17 0929    Visit Number  23    Number of Visits  30    Date for PT Re-Evaluation  09/25/17    Authorization Type  UHC MCR    Authorization Time Period  KX at 15  progress at visit 30 ( 20 done 08/20/17),     PT Start Time  0925    PT Stop Time  1005    PT Time Calculation (min)  40 min    Activity Tolerance  Patient tolerated treatment well    Behavior During Therapy  [facility] for tasks assessed/performed       Past Medical History:  Diagnosis Date  . Allergy   . Gout   . Hyperlipidemia   . Hypertension     Past Surgical History:  Procedure Laterality Date  . APPENDECTOMY  1986  . CATARACT EXTRACTION      There were no vitals filed for this visit.  Subjective Assessment - 08/31/17 0928    Subjective  No complaints. Slept on shoulder last night and alittle sore.     Currently in Pain?  Yes    Pain Score  2     Pain Location  Shoulder    Pain Orientation  Right    Pain Descriptors / Indicators  Aching    Pain Type  Surgical pain    Pain Onset  More than a month ago    Pain Frequency  Intermittent    Aggravating Factors   stretch , lying on RT side    Pain Relieving Factors  rest         [facility] PT Assessment - 08/31/17 0001      AROM   Right Shoulder Flexion  129 Degrees sitting more into scaption    Right Shoulder ABduction  105 Degrees sitting    Right Shoulder Internal Rotation  -- can reasch to L5     Right Shoulder External Rotation  60 Degrees sitting                   [facility] Adult PT Treatment/Exercise - 08/31/17 0001      Shoulder Exercises: Standing   Other Standing Exercises  abduction and flexion x  12 with 2 pounds bilaterally below 90 degrees no incr pain.   then reach to upper cabinets middle shelf x 15   then reach to bottom of top shelf x 15 2 pounds, then bent rows x 15 5 pound kettle bell.     Other Standing Exercises  bicep curls x 15 8 pounds , then counter short range push ups x 15, then press ball into cabinets door  x  20 circles  elbow bent and straight, then overhead press 2 pounds tactile guidance and VC for alignment      Shoulder Exercises: ROM/Strengthening   UBE (Upper Arm Bike)  L3  4/4 min             PT Education - 08/31/17 1007    Education provided  Yes    Education Details  IR stretch    Person(s) Educated  Patient    Methods  Explanation;Demonstration;Tactile cues;Verbal cues;Handout    Comprehension  Returned  demonstration;Verbalized understanding       PT Short Term Goals - 07/06/17 1013      PT SHORT TERM GOAL #1   Title  He will report pain decr 20-30% .       Status  Achieved      PT SHORT TERM GOAL #2   Title  PROM within protocol limitations    Status  Achieved        PT Long Term Goals - 08/27/17 1011      PT LONG TERM GOAL #1   Title  He will be indpendent with all HEP    Status  On-going      PT LONG TERM GOAL #2   Title  He will have active ROM to be able to reach into cabinets and dress independnetly with RT arm.     Status  Achieved      PT LONG TERM GOAL #3   Title  FOTO score improved to   < 40 % limited to indicate  funcitonal improvement     Status  Unable to assess      PT LONG TERM GOAL #4   Title  He will report  performing  normal home tasks with RT arm. with 1-2/10 pain    Baseline  reports using arm more    Status  On-going      PT LONG TERM GOAL #5   Title  He will return to light yard work with 1-2 max pain    Baseline  not doing yard work    Status  On-going      PT LONG TERM GOAL #6   Title  He will return to golf if allowed by MD    Baseline  not allowed    Status  On-going             Plan - 08/31/17 0932    Clinical Impression Statement    Week 15. Slowly improving strength. IR limited and needs to start stretching this. Lt shoulder not in good shape so cautioned to not have LT shoulder pain with stretching    PT Treatment/Interventions  Cryotherapy;Passive range of motion;Manual techniques;Patient/family education    PT Next Visit Plan  progress  light strengthening exer.   IR stretch    PT Home Exercise Plan  scapula retract and depression , pendulum , hand hold AAROM asssited , wall slide with Lt arm assist, prone hor abduction and ext, cane standing flex/abduct/ ER ,  supine bench  press and tricep      Consulted and Agree with Plan of Care  Patient       Patient will benefit from skilled therapeutic intervention in order to improve the following deficits and impairments:  Pain, Impaired UE functional use, Decreased range of motion, Decreased strength, Decreased activity tolerance, Postural dysfunction  Visit Diagnosis: Stiffness of right shoulder, not elsewhere classified  Muscle weakness (generalized)  Abnormal posture  Acute pain of right shoulder     Problem List Patient Active Problem List   Diagnosis Date Noted  . Complete tear of left rotator cuff 05/19/2017  . Peripheral neuropathy 01/17/2013  . HYPERTENSION 03/17/2007  . HEMOCCULT POSITIVE STOOL 03/17/2007  . 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 HEMORRHOIDS, INTERNAL 01/20/2001  . DIVERTICULOSIS, COLON 01/20/2001    [name]  PT 08/31/2017, 10:12 AM  [facility] [address] [place], [place], [address] Phone: [phone]   Fax:  [phone]  Name: Eliza Grissinger  [place] MRN: [id number] Date of Birth: Aug 01, 1945

## 2017-09-03 ENCOUNTER — Ambulatory Visit: Payer: Medicare Other

## 2017-09-03 ENCOUNTER — Ambulatory Visit: Payer: Medicare Other | Admitting: Physical Therapy

## 2017-09-03 DIAGNOSIS — M25511 Pain in right shoulder: Secondary | ICD-10-CM | POA: Diagnosis not present

## 2017-09-03 DIAGNOSIS — R293 Abnormal posture: Secondary | ICD-10-CM

## 2017-09-03 DIAGNOSIS — M25611 Stiffness of right shoulder, not elsewhere classified: Secondary | ICD-10-CM

## 2017-09-03 DIAGNOSIS — M6281 Muscle weakness (generalized): Secondary | ICD-10-CM

## 2017-09-03 NOTE — Therapy (Signed)
[place], [place], [address] Phone: [phone]   Fax:  [phone]  Physical Therapy Treatment  Patient Details  Name: Randy Moreno MRN: [id number] Date of Birth: Jan 06, 1946 Referring Provider: Rodell Perna, MD   Encounter Date: 09/03/2017  PT End of Session - 09/03/17 0837    Visit Number  24    Number of Visits  30    Date for PT Re-Evaluation  09/25/17    Authorization Type  [employer] MCR    Authorization Time Period  KX at 15  progress at visit 30 ( 20 done 08/20/17),     PT Start Time  0836    PT Stop Time  0914    PT Time Calculation (min)  38 min    Activity Tolerance  Patient tolerated treatment well    Behavior During Therapy  [facility] for tasks assessed/performed       Past Medical History:  Diagnosis Date  . Allergy   . Gout   . Hyperlipidemia   . Hypertension     Past Surgical History:  Procedure Laterality Date  . APPENDECTOMY  1986  . CATARACT EXTRACTION      There were no vitals filed for this visit.  Subjective Assessment - 09/03/17 0845    Subjective  Reportrs soreness form exercise yesterday    Currently in Pain?  Yes    Pain Score  -- 1.5    Pain Location  Shoulder    Pain Orientation  Right;Anterior    Pain Descriptors / Indicators  Aching    Pain Type  Surgical pain    Pain Onset  More than a month ago    Pain Frequency  Intermittent    Aggravating Factors   stretch, exer, lye Rt side    Pain Relieving Factors  rest                       [facility] Adult PT Treatment/Exercise - 09/03/17 0001      Shoulder Exercises: Seated   Other Seated Exercises  overhead holding x 10 sec 10 reps      Shoulder Exercises: Sidelying   External Rotation  Right;10 reps    External Rotation Weight (lbs)  3    External Rotation Limitations   3sets    ABduction  Right;15 reps      Shoulder Exercises: Standing   External Rotation Limitations  25 reps green    Internal Rotation  Limitations  25 reps green    Theraband Level (Shoulder Flexion)  Level 3 (Green)    Extension Limitations  25 reps green    Other Standing Exercises  abduction and flexion x 12 with 2 pounds bilaterally below 90 degrees no incr pain.   then reach to upper cabinets middle shelf x 15   then reach to bottom of top shelf x 15 2 pounds, then bent rows x 15 5 pound kettle bell.     Other Standing Exercises  overhead press 2 pounds x 15 rreps      Shoulder Exercises: ROM/Strengthening   UBE (Upper Arm Bike)  L3  4/4 min               PT Short Term Goals - 07/06/17 1013      PT SHORT TERM GOAL #1   Title  He will report pain decr 20-30% .       Status  Achieved      PT  SHORT TERM GOAL #2   Title  PROM within protocol limitations    Status  Achieved        PT Long Term Goals - 09/03/17 0912      PT LONG TERM GOAL #1   Title  He will be indpendent with all HEP    Baseline  doing all exer issued so far    Status  On-going      PT LONG TERM GOAL #2   Title  He will have active ROM to be able to reach into cabinets and dress independently with RT arm.     Baseline  able to mid shelf head height    Status  Achieved      PT LONG TERM GOAL #3   Title  FOTO score improved to   < 40 % limited to indicate  funcitonal improvement     Status  Unable to assess      PT LONG TERM GOAL #4   Title  He will report  performing  normal home tasks with RT arm. with 1-2/10 pain    Baseline  reports using arm more    Status  On-going      PT LONG TERM GOAL #5   Baseline  not doing yard work    Status  On-going      PT LONG TERM GOAL #6   Title  He will return to golf if allowed by MD    Baseline  not allowed    Status  On-going            Plan - 09/03/17 0838    Clinical Impression Statement  Slight incr soreness . Suggested no exercise tomorrow , exer saturday then no exercise Sunday to rest shoulder.   Careful progression of strength to not irritate shoulder    PT  Treatment/Interventions  Cryotherapy;Passive range of motion;Manual techniques;Patient/family education    PT Next Visit Plan  progress  light strengthening exer.   IR stretch    PT Home Exercise Plan  scapula retract and depression , pendulum , hand hold AAROM asssited , wall slide with Lt arm assist, prone hor abduction and ext, cane standing flex/abduct/ ER ,  supine bench  press and tricep      Consulted and Agree with Plan of Care  Patient       Patient will benefit from skilled therapeutic intervention in order to improve the following deficits and impairments:  Pain, Impaired UE functional use, Decreased range of motion, Decreased strength, Decreased activity tolerance, Postural dysfunction  Visit Diagnosis: Stiffness of right shoulder, not elsewhere classified  Muscle weakness (generalized)  Abnormal posture  Acute pain of right shoulder     Problem List Patient Active Problem List   Diagnosis Date Noted  . Complete tear of left rotator cuff 05/19/2017  . Peripheral neuropathy 01/17/2013  . HYPERTENSION 03/17/2007  . HEMOCCULT POSITIVE STOOL 03/17/2007  . 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 HEMORRHOIDS, INTERNAL 01/20/2001  . DIVERTICULOSIS, COLON 01/20/2001    [name]  PT 09/03/2017, 9:14 AM  [facility] [address] [place], [place], [address] Phone: [phone]   Fax:  [phone]  Name: Randy Moreno MRN: [id number] Date of Birth: 11/26/1945

## 2017-09-07 ENCOUNTER — Ambulatory Visit: Payer: Medicare Other

## 2017-09-07 DIAGNOSIS — R293 Abnormal posture: Secondary | ICD-10-CM

## 2017-09-07 DIAGNOSIS — M25511 Pain in right shoulder: Secondary | ICD-10-CM | POA: Diagnosis not present

## 2017-09-07 DIAGNOSIS — M6281 Muscle weakness (generalized): Secondary | ICD-10-CM

## 2017-09-07 DIAGNOSIS — M25611 Stiffness of right shoulder, not elsewhere classified: Secondary | ICD-10-CM

## 2017-09-07 NOTE — Therapy (Signed)
[place], [place], [address] Phone: [phone]   Fax:  [phone]  Physical Therapy Treatment  Patient Details  Name: Randy Moreno MRN: [id number] Date of Birth: [birth year] Referring Provider: Rodell Perna, MD   Encounter Date: 09/07/2017  PT End of Session - 09/07/17 0942    Visit Number  25    Number of Visits  30    Date for PT Re-Evaluation  09/25/17    Authorization Type  UHC MCR    Authorization Time Period  KX at 15  progress at visit 30 ( 20 done 08/20/17),     PT Start Time  0930    PT Stop Time  1013    PT Time Calculation (min)  43 min    Activity Tolerance  Patient tolerated treatment well    Behavior During Therapy  [facility] for tasks assessed/performed       Past Medical History:  Diagnosis Date  . Allergy   . Gout   . Hyperlipidemia   . Hypertension     Past Surgical History:  Procedure Laterality Date  . APPENDECTOMY  1986  . CATARACT EXTRACTION      There were no vitals filed for this visit.  Subjective Assessment - 09/07/17 1006    Subjective  very mild pain /soreness .5/10     Currently in Pain?  Yes    Pain Score  -- .5    Pain Location  Shoulder    Pain Orientation  Right;Anterior    Pain Descriptors / Indicators  Aching    Pain Onset  More than a month ago    Pain Frequency  Intermittent    Aggravating Factors   exercises                       [facility] Adult PT Treatment/Exercise - 09/07/17 0001      Shoulder Exercises: Prone   Extension  Right;10 reps    Extension Weight (lbs)  3    Horizontal ABduction 1  Right;10 reps    Horizontal ABduction 1 Weight (lbs)  3    Other Prone Exercises  isometric end range holds ER and hor abduction x10-12 reps 5 se or more      Shoulder Exercises: Sidelying   External Rotation  Right;10 reps    External Rotation Weight (lbs)  3    External Rotation Limitations   3sets    ABduction  Right;10 reps;Weights    ABduction Weight (lbs)  3,2 set1 , set 2      Shoulder Exercises: Standing   External Rotation Limitations  25 reps green    Internal Rotation  Right;20 reps    Theraband Level (Shoulder Flexion)  Level 3 (Green)    Extension Limitations  25 reps green    Other Standing Exercises  abduction and flexion x 20 with 2 pounds  below 90 degrees no incr pain.   then reach to upper cabinets middle shelf x 15   then reach to bottom of top shelf x 15 2 pounds, then bent rows x 15 5 pound kettle bell.     Other Standing Exercises  overhead press 2 pounds x 15 rreps, gentil assist and cued for scap retraction and elbow ext for max range of lift.      Shoulder Exercises: ROM/Strengthening   UBE (Upper Arm Bike)  L3  4/4 min      Shoulder Exercises: Stretch   Internal  Rotation Stretch  -- 15 reps behind back      Manual Therapy   Passive ROM  flexion and ER                PT Short Term Goals - 07/06/17 1013      PT SHORT TERM GOAL #1   Title  He will report pain decr 20-30% .       Status  Achieved      PT SHORT TERM GOAL #2   Title  PROM within protocol limitations    Status  Achieved        PT Long Term Goals - 09/03/17 0912      PT LONG TERM GOAL #1   Title  He will be indpendent with all HEP    Baseline  doing all exer issued so far    Status  On-going      PT LONG TERM GOAL #2   Title  He will have active ROM to be able to reach into cabinets and dress independently with RT arm.     Baseline  able to mid shelf head height    Status  Achieved      PT LONG TERM GOAL #3   Title  FOTO score improved to   < 40 % limited to indicate  funcitonal improvement     Status  Unable to assess      PT LONG TERM GOAL #4   Title  He will report  performing  normal home tasks with RT arm. with 1-2/10 pain    Baseline  reports using arm more    Status  On-going      PT LONG TERM GOAL #5   Baseline  not doing yard work    Status  On-going      PT LONG TERM GOAL #6   Title  He  will return to golf if allowed by MD    Baseline  not allowed    Status  On-going            Plan - 09/07/17 1007    Clinical Impression Statement  very mild soreness  . Able to hole at end range prone ext, hor abd, flexion.  Still weak but progressing.     PT Treatment/Interventions  Cryotherapy;Passive range of motion;Manual techniques;Patient/family education    PT Next Visit Plan  progress  light strengthening exer.   IR stretch    PT Home Exercise Plan  scapula retract and depression , pendulum , hand hold AAROM asssited , wall slide with Lt arm assist, prone hor abduction and ext, cane standing flex/abduct/ ER ,  supine bench  press and tricep      Consulted and Agree with Plan of Care  Patient       Patient will benefit from skilled therapeutic intervention in order to improve the following deficits and impairments:  Pain, Impaired UE functional use, Decreased range of motion, Decreased strength, Decreased activity tolerance, Postural dysfunction  Visit Diagnosis: Stiffness of right shoulder, not elsewhere classified  Muscle weakness (generalized)  Abnormal posture  Acute pain of right shoulder     Problem List Patient Active Problem List   Diagnosis Date Noted  . Complete tear of left rotator cuff 05/19/2017  . Peripheral neuropathy 01/17/2013  . HYPERTENSION 03/17/2007  . HEMOCCULT POSITIVE STOOL 03/17/2007  . 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 HEMORRHOIDS, INTERNAL 01/20/2001  . DIVERTICULOSIS, COLON 01/20/2001    [name]  PT 09/07/2017,  10:14 AM  [place], [place], [address] Phone: [phone]   Fax:  [phone]  Name: TRAPPER MEECH MRN: [id number] Date of Birth: Jul 19, 1945

## 2017-09-10 ENCOUNTER — Ambulatory Visit: Payer: Medicare Other | Admitting: Physical Therapy

## 2017-09-10 ENCOUNTER — Encounter: Payer: Self-pay | Admitting: Physical Therapy

## 2017-09-10 DIAGNOSIS — M25511 Pain in right shoulder: Secondary | ICD-10-CM

## 2017-09-10 DIAGNOSIS — M25611 Stiffness of right shoulder, not elsewhere classified: Secondary | ICD-10-CM

## 2017-09-10 DIAGNOSIS — M6281 Muscle weakness (generalized): Secondary | ICD-10-CM

## 2017-09-10 DIAGNOSIS — R293 Abnormal posture: Secondary | ICD-10-CM

## 2017-09-10 NOTE — Therapy (Signed)
[place], [place], [address] Phone: [phone]   Fax:  [phone]  Physical Therapy Treatment  Patient Details  Name: Randy Moreno MRN: [id number] Date of Birth: Jul 16, 1945 Referring Provider: Rodell Perna, MD   Encounter Date: 09/10/2017  PT End of Session - 09/10/17 2207    Visit Number  26    Number of Visits  30    Date for PT Re-Evaluation  09/25/17    Authorization Type  [employer] MCR    Authorization Time Period  KX at 15  progress at visit 30 ( 20 done 08/20/17),     PT Start Time  1546    PT Stop Time  1626    PT Time Calculation (min)  40 min    Activity Tolerance  Patient tolerated treatment well       Past Medical History:  Diagnosis Date  . Allergy   . Gout   . Hyperlipidemia   . Hypertension     Past Surgical History:  Procedure Laterality Date  . APPENDECTOMY  1986  . CATARACT EXTRACTION      There were no vitals filed for this visit.  Subjective Assessment - 09/10/17 1549    Subjective  Patient reports his pain is mild. His pain is about a 1-2/10.     Pertinent History  he was moving rock into truck at time of shoulder issues starting    Patient Stated Goals  REturn to use RT arm normally    Currently in Pain?  Yes    Pain Score  1     Pain Location  Shoulder    Pain Orientation  Right;Anterior    Pain Descriptors / Indicators  Aching    Pain Type  Surgical pain    Pain Onset  More than a month ago    Pain Frequency  Intermittent    Aggravating Factors   exercises     Pain Relieving Factors  rest     Effect of Pain on Daily Activities  an ache at times                        [facility] Adult PT Treatment/Exercise - 09/10/17 0001      Shoulder Exercises: Prone   Extension  Right;10 reps    Extension Weight (lbs)  3    Horizontal ABduction 1  Right;10 reps    Horizontal ABduction 1 Weight (lbs)  3      Shoulder Exercises: Sidelying   External Rotation   Right;20 reps;10 reps    External Rotation Weight (lbs)  3    External Rotation Limitations   3sets    ABduction  Right;10 reps;Weights    ABduction Weight (lbs)  2lb       Shoulder Exercises: Standing   External Rotation Limitations  25 reps green    Internal Rotation  Right;20 reps    Theraband Level (Shoulder Flexion)  Level 3 (Green)    Extension Limitations  25 reps green    Other Standing Exercises  abduction and flexion x 20 with 2 pounds  below 90 degrees no incr pain.   then reach to upper cabinets middle shelf x 15   then reach to bottom of top shelf x 15 2 pounds, then bent rows x 15 5 pound kettle bell.     Other Standing Exercises  overhead press 2 pounds x 15 rreps, gentil assist and cued for scap retraction  and elbow ext for max range of lift.      Shoulder Exercises: ROM/Strengthening   UBE (Upper Arm Bike)  L3  4/4 min      Manual Therapy   Passive ROM  flexion and ER              PT Education - 09/10/17 2206    Education provided  Yes    Education Details  technique with ther-ex     Person(s) Educated  Patient    Methods  Explanation;Demonstration;Tactile cues;Verbal cues    Comprehension  Verbalized understanding;Returned demonstration;Verbal cues required       PT Short Term Goals - 09/10/17 2208      PT SHORT TERM GOAL #1   Title  He will report pain decr 20-30% .       Time  4    Period  Weeks    Status  Achieved      PT SHORT TERM GOAL #2   Title  PROM within protocol limitations    Baseline  progressing as expected     Time  4    Period  Weeks    Status  Achieved        PT Long Term Goals - 09/03/17 0912      PT LONG TERM GOAL #1   Title  He will be indpendent with all HEP    Baseline  doing all exer issued so far    Status  On-going      PT LONG TERM GOAL #2   Title  He will have active ROM to be able to reach into cabinets and dress independently with RT arm.     Baseline  able to mid shelf head height    Status  Achieved       PT LONG TERM GOAL #3   Title  FOTO score improved to   < 40 % limited to indicate  funcitonal improvement     Status  Unable to assess      PT LONG TERM GOAL #4   Title  He will report  performing  normal home tasks with RT arm. with 1-2/10 pain    Baseline  reports using arm more    Status  On-going      PT LONG TERM GOAL #5   Baseline  not doing yard work    Status  On-going      PT LONG TERM GOAL #6   Title  He will return to golf if allowed by MD    Baseline  not allowed    Status  On-going            Plan - 09/10/17 2207    Clinical Impression Statement  Patient continues to make good progress. Continue to progress exercises as tolerance. He had full range today with some stiffness at end range ER.     Clinical Presentation  Stable    Clinical Decision Making  Low    Rehab Potential  Good    PT Frequency  2x / week    PT Duration  12 weeks    PT Treatment/Interventions  Cryotherapy;Passive range of motion;Manual techniques;Patient/family education    PT Next Visit Plan  progress  light strengthening exer.   IR stretch    PT Home Exercise Plan  scapula retract and depression , pendulum , hand hold AAROM asssited , wall slide with Lt arm assist, prone hor abduction and ext, cane standing flex/abduct/ ER ,  supine  bench  press and tricep      Consulted and Agree with Plan of Care  Patient    Family Member Consulted  spouse       Patient will benefit from skilled therapeutic intervention in order to improve the following deficits and impairments:  Pain, Impaired UE functional use, Decreased range of motion, Decreased strength, Decreased activity tolerance, Postural dysfunction  Visit Diagnosis: Stiffness of right shoulder, not elsewhere classified  Muscle weakness (generalized)  Abnormal posture  Acute pain of right shoulder     Problem List Patient Active Problem List   Diagnosis Date Noted  . Complete tear of left rotator cuff 05/19/2017  . Peripheral  neuropathy 01/17/2013  . HYPERTENSION 03/17/2007  . HEMOCCULT POSITIVE STOOL 03/17/2007  . 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 HEMORRHOIDS, INTERNAL 01/20/2001  . DIVERTICULOSIS, COLON 01/20/2001    Carney Living PT DPT  09/10/2017, 10:09 PM  [facility] [address] [place], [place], [address] Phone: [phone]   Fax:  [phone]  Name: Randy Moreno MRN: [id number] Date of Birth: 18-Oct-1945

## 2017-09-14 ENCOUNTER — Ambulatory Visit: Payer: Medicare Other | Attending: Orthopaedic Surgery

## 2017-09-14 DIAGNOSIS — R293 Abnormal posture: Secondary | ICD-10-CM | POA: Diagnosis present

## 2017-09-14 DIAGNOSIS — M25511 Pain in right shoulder: Secondary | ICD-10-CM

## 2017-09-14 DIAGNOSIS — M6281 Muscle weakness (generalized): Secondary | ICD-10-CM | POA: Insufficient documentation

## 2017-09-14 DIAGNOSIS — M25611 Stiffness of right shoulder, not elsewhere classified: Secondary | ICD-10-CM | POA: Diagnosis not present

## 2017-09-14 NOTE — Therapy (Signed)
[place], [place], [address] Phone: [phone]   Fax:  [phone]  Physical Therapy Treatment  Patient Details  Name: Randy Moreno MRN: [id number] Date of Birth: 04-May-1945 Referring Provider: Rodell Perna, MD   Encounter Date: 09/14/2017  PT End of Session - 09/14/17 1016    Visit Number  27    Number of Visits  30    Date for PT Re-Evaluation  09/25/17    Authorization Type  UHC MCR    Authorization Time Period  KX at 15  progress at visit 30 ( 20 done 08/20/17),     PT Start Time  1015    PT Stop Time  1100    PT Time Calculation (min)  45 min    Activity Tolerance  Patient tolerated treatment well    Behavior During Therapy  [facility] for tasks assessed/performed       Past Medical History:  Diagnosis Date  . Allergy   . Gout   . Hyperlipidemia   . Hypertension     Past Surgical History:  Procedure Laterality Date  . APPENDECTOMY  1986  . CATARACT EXTRACTION      There were no vitals filed for this visit.  Subjective Assessment - 09/14/17 1018    Subjective  Very mild pain 1/10. Ant RT shoulder    Pain Score  1     Pain Location  Shoulder    Pain Orientation  Right;Anterior    Pain Descriptors / Indicators  Aching    Pain Type  Surgical pain    Pain Onset  More than a month ago    Pain Frequency  Intermittent    Aggravating Factors   stretch /exer    Pain Relieving Factors  rest                       [facility] Adult PT Treatment/Exercise - 09/14/17 0001      Shoulder Exercises: Standing   External Rotation Limitations  15 reps blue     Internal Rotation  Right;15 reps    Internal Rotation Limitations  blue band    Flexion  Right;20 reps    Theraband Level (Shoulder Flexion)  Level 4 (Blue)    Extension Limitations  25 reps blue    Other Standing Exercises  abduction and flexion x 20 with 2 pounds  below 90 degrees no incr pain.   then reach to upper cabinets upper shelf  no weight x 10 ,middle shelf x 15 1 pound,  then reach to bottom of top shelf x 15 1 pounds, then bent rows x 15 10 pound kettle bell.     Other Standing Exercises  overhead press 2 pounds x 15 rreps, gentil assist and cued for scap retraction and elbow ext for max range of lift.      Shoulder Exercises: ROM/Strengthening   UBE (Upper Arm Bike)  L4 3/3               PT Short Term Goals - 09/10/17 2208      PT SHORT TERM GOAL #1   Title  He will report pain decr 20-30% .       Time  4    Period  Weeks    Status  Achieved      PT SHORT TERM GOAL #2   Title  PROM within protocol limitations    Baseline  progressing as expected  Time  4    Period  Weeks    Status  Achieved        PT Long Term Goals - 09/03/17 0912      PT LONG TERM GOAL #1   Title  He will be indpendent with all HEP    Baseline  doing all exer issued so far    Status  On-going      PT LONG TERM GOAL #2   Title  He will have active ROM to be able to reach into cabinets and dress independently with RT arm.     Baseline  able to mid shelf head height    Status  Achieved      PT LONG TERM GOAL #3   Title  FOTO score improved to   < 40 % limited to indicate  funcitonal improvement     Status  Unable to assess      PT LONG TERM GOAL #4   Title  He will report  performing  normal home tasks with RT arm. with 1-2/10 pain    Baseline  reports using arm more    Status  On-going      PT LONG TERM GOAL #5   Baseline  not doing yard work    Status  On-going      PT LONG TERM GOAL #6   Title  He will return to golf if allowed by MD    Baseline  not allowed    Status  On-going            Plan - 09/14/17 1017    Clinical Impression Statement  Still limited with heavy lifting  and using arm for normal home tasks as he admits he has been compensating for so long he does most thing with Lt arm.. Encouraged tpo start doing more basic activity with RT arm.     PT Treatment/Interventions   Cryotherapy;Passive range of motion;Manual techniques;Patient/family education    PT Next Visit Plan  progress  light strengthening exer.   IR stretch    PT Home Exercise Plan  scapula retract and depression , pendulum , hand hold AAROM asssited , wall slide with Lt arm assist, prone hor abduction and ext, cane standing flex/abduct/ ER ,  supine bench  press and tricep      Consulted and Agree with Plan of Care  Patient       Patient will benefit from skilled therapeutic intervention in order to improve the following deficits and impairments:  Pain, Impaired UE functional use, Decreased range of motion, Decreased strength, Decreased activity tolerance, Postural dysfunction  Visit Diagnosis: Stiffness of right shoulder, not elsewhere classified  Muscle weakness (generalized)  Abnormal posture  Acute pain of right shoulder     Problem List Patient Active Problem List   Diagnosis Date Noted  . Complete tear of left rotator cuff 05/19/2017  . Peripheral neuropathy 01/17/2013  . HYPERTENSION 03/17/2007  . HEMOCCULT POSITIVE STOOL 03/17/2007  . 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 HEMORRHOIDS, INTERNAL 01/20/2001  . DIVERTICULOSIS, COLON 01/20/2001    [name]  PT 09/14/2017, 11:01 AM  [facility] [address] [place], [place], [address] Phone: [phone]   Fax:  [phone]  Name: Randy Moreno MRN: [id number] Date of Birth: 1946-03-04

## 2017-09-21 ENCOUNTER — Ambulatory Visit: Payer: Medicare Other

## 2017-09-21 DIAGNOSIS — M25611 Stiffness of right shoulder, not elsewhere classified: Secondary | ICD-10-CM | POA: Diagnosis not present

## 2017-09-21 DIAGNOSIS — M6281 Muscle weakness (generalized): Secondary | ICD-10-CM

## 2017-09-21 NOTE — Therapy (Addendum)
[place], [place], [address] Phone: [phone]   Fax:  [phone]  Physical Therapy Treatment/Discharge  Patient Details  Name: Randy Moreno MRN: [id number] Date of Birth: 03/25/1945 Referring Provider: Rodell Perna, MD   Encounter Date: 09/21/2017  PT End of Session - 09/21/17 0953    Date for PT Re-Evaluation  10/30/17    Authorization Type  UHC MCR    Authorization Time Period  KX at 15  progress at visit 30 ( 20 done 08/20/17),     PT Start Time  0925    PT Stop Time  1010    PT Time Calculation (min)  45 min    Activity Tolerance  Patient tolerated treatment well    Behavior During Therapy  [facility] for tasks assessed/performed      VISIT 28 Past Medical History:  Diagnosis Date  . Allergy   . Gout   . Hyperlipidemia   . Hypertension     Past Surgical History:  Procedure Laterality Date  . APPENDECTOMY  1986  . CATARACT EXTRACTION      There were no vitals filed for this visit.  Subjective Assessment - 09/21/17 0933    Subjective  Doing about the same, pain 1/10    Pain Score  1     Pain Location  Shoulder    Pain Orientation  Right;Anterior    Pain Descriptors / Indicators  Aching    Pain Type  Surgical pain    Pain Onset  More than a month ago    Pain Frequency  Intermittent    Aggravating Factors   exer    Pain Relieving Factors  rest         [facility] PT Assessment - 09/21/17 0001      AROM   Right Shoulder Flexion  125 Degrees    Right Shoulder ABduction  126 Degrees    Right Shoulder Internal Rotation  -- RT thumb to L1     Right Shoulder External Rotation  70 Degrees      Strength   Overall Strength Comments  RT shoulder flexion 4-/5  ,  abduction 3+/5,  extension  5/5  , adduction  5/5,  IR 5/5  ER 4+/5                   [facility] Adult PT Treatment/Exercise - 09/21/17 0001      Bed Mobility   Bed Mobility  Sitting - Scoot to Edge of Bed      Shoulder  Exercises: Sidelying   External Rotation  Right;20 reps    External Rotation Weight (lbs)  4    ABduction  Right;20 reps    ABduction Weight (lbs)  3      Shoulder Exercises: Standing   External Rotation Limitations  15 reps blue     Internal Rotation  Right;15 reps    Internal Rotation Limitations  blue band    Flexion  Right;20 reps    Theraband Level (Shoulder Flexion)  Level 4 (Blue)    Extension Limitations  25 reps blue    Other Standing Exercises  abduction and flexion x 20 with 2 pounds  below 90 degrees no incr pain.   then reach to upper cabinets upper shelf no weight x 10 ,middle shelf x 15 1 pound,  then reach to bottom of top shelf x 15 1 pounds, then bent rows x 15 10 pound kettle bell.  Other Standing Exercises  overhead press 2 pounds x 15 rreps, gentil assist and cued for scap retraction and elbow ext for max range of lift.      Shoulder Exercises: ROM/Strengthening   UBE (Upper Arm Bike)  L4 3/3               PT Short Term Goals - 09/10/17 2208      PT SHORT TERM GOAL #1   Title  He will report pain decr 20-30% .       Time  4    Period  Weeks    Status  Achieved      PT SHORT TERM GOAL #2   Title  PROM within protocol limitations    Baseline  progressing as expected     Time  4    Period  Weeks    Status  Achieved        PT Long Term Goals - 09/21/17 0630      PT LONG TERM GOAL #1   Title  He will be independent with all HEP    Status  On-going      PT LONG TERM GOAL #2   Title  He will have active ROM to be able to reach into cabinets and dress independently with RT arm.     Status  Achieved      PT LONG TERM GOAL #3   Title  FOTO score improved to   < 40 % limited to indicate  funcitonal improvement       PT LONG TERM GOAL #4   Title  He will report  performing  normal home tasks with RT arm. with 1-2/10 pain    Status  On-going      PT LONG TERM GOAL #5   Title  He will return to light yard work with 1-2 max pain    Baseline   not doing yard work    Status  On-going      PT LONG TERM GOAL #6   Title  He will return to golf if allowed by MD    Baseline  not allowed    Status  On-going            Plan - 09/21/17 0927    Clinical Impression Statement  Progressing slowly with strength as we started slow to protect repair.  Need to improve strength for functional improvement so would benefit from extension.    PT Frequency  2x / week    PT Duration  6 weeks    PT Treatment/Interventions  Cryotherapy;Passive range of motion;Manual techniques;Patient/family education    PT Next Visit Plan  progress  light strengthening exer.   IR stretch    PT Home Exercise Plan  scapula retract and depression , pendulum , hand hold AAROM asssited , wall slide with Lt arm assist, prone hor abduction and ext, cane standing flex/abduct/ ER ,  supine bench  press and tricep      Consulted and Agree with Plan of Care  Patient       Patient will benefit from skilled therapeutic intervention in order to improve the following deficits and impairments:  Pain, Impaired UE functional use, Decreased range of motion, Decreased strength, Decreased activity tolerance, Postural dysfunction  Visit Diagnosis: Stiffness of right shoulder, not elsewhere classified  Muscle weakness (generalized)     Problem List Patient Active Problem List   Diagnosis Date Noted  . Complete tear of left rotator cuff 05/19/2017  .  Peripheral neuropathy 01/17/2013  . HYPERTENSION 03/17/2007  . HEMOCCULT POSITIVE STOOL 03/17/2007  . 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 HEMORRHOIDS, INTERNAL 01/20/2001  . DIVERTICULOSIS, COLON 01/20/2001    [name] PT 09/21/2017, 10:06 AM  [facility] [address] [place], [place], [address] Phone: [phone]   Fax:  [phone]  Name: Randy Moreno MRN: [id number] Date of Birth: 06/05/1945  PHYSICAL THERAPY DISCHARGE SUMMARY  Visits from Start  of Care: 28  Current functional level related to goals / functional outcomes: See above   Remaining deficits: See above   Education / Equipment: HEP Plan: Patient agrees to discharge.  Patient goals were partially met. Patient is being discharged due to the physician's request.  ?????  Pearson Forster PT  09/24/17

## 2017-09-23 ENCOUNTER — Ambulatory Visit (INDEPENDENT_AMBULATORY_CARE_PROVIDER_SITE_OTHER): Payer: Medicare Other | Admitting: Orthopaedic Surgery

## 2017-09-23 ENCOUNTER — Encounter (INDEPENDENT_AMBULATORY_CARE_PROVIDER_SITE_OTHER): Payer: Self-pay | Admitting: Orthopaedic Surgery

## 2017-09-23 VITALS — BP 120/65 | HR 71 | Ht 67.5 in | Wt 195.0 lb

## 2017-09-23 DIAGNOSIS — Z9889 Other specified postprocedural states: Secondary | ICD-10-CM

## 2017-09-23 NOTE — Progress Notes (Signed)
Office Visit Note   Patient: Randy Moreno           Date of Birth: [birth year]           MRN: [id number] Visit Date: 09/23/2017              Requested by: Crist Infante, MD [address] [place], [place] [address] PCP: Crist Infante, MD   Assessment & Plan: Visit Diagnoses:  1. Status post right rotator cuff repair     Plan: Patient can go out and hit small bucket of balls and then progress with golf activity as tolerated.  He has good range of motion mild discomfort in the shoulder.  He can finish up his therapy and will return here on a as needed basis.  Follow-Up Instructions: Return if symptoms worsen or fail to improve.   Orders:  No orders of the defined types were placed in this encounter.  No orders of the defined types were placed in this encounter.     Procedures: No procedures performed   Clinical Data: No additional findings.   Subjective: Chief Complaint  Patient presents with  . Right Shoulder - Follow-up    HPI patient returns post right shoulder rotator cuff repair with 1 inch retracted cuff.  Patient also had biceps tendon tear surgery date 05/18/2017.  He get his arm up over his head he has been going to physical therapy now for several months.  He states his shoulder aches to some degree at the end of the day but does not hurt as much as he did before the surgery.  He can reach behind him cross arm test is good.  Therapy notes note that he is getting gradual improvement.    Review of Systems 14 point review of systems updated unchanged from March 2019 surgery on his shoulder.  Objective: Vital Signs: BP 120/65   Pulse 71   Ht 5' 7.5" (1.715 m)   Wt 195 lb (88.5 kg)   BMI 30.09 kg/m   Physical Exam  Constitutional: He is oriented to person, place, and time. He appears well-developed and well-nourished.  HENT:  Head: Normocephalic and atraumatic.  Eyes: Pupils are equal, round, and reactive to light. EOM are normal.  Neck: No tracheal deviation  present. No thyromegaly present.  Cardiovascular: Normal rate.  Pulmonary/Chest: Effort normal. He has no wheezes.  Abdominal: Soft. Bowel sounds are normal.  Neurological: He is alert and oriented to person, place, and time.  Skin: Skin is warm and dry. Capillary refill takes less than 2 seconds.  Psychiatric: He has a normal mood and affect. His behavior is normal. Judgment and thought content normal.    Ortho Exam well-healed incision he can get his arm rapidly over his head.  Incisions well-healed sensation of the hand is intact no edema. Specialty Comments:  No specialty comments available.  Imaging: No results found.   PMFS History: Patient Active Problem List   Diagnosis Date Noted  . Complete tear of left rotator cuff 05/19/2017  . Peripheral neuropathy 01/17/2013  . HYPERTENSION 03/17/2007  . HEMOCCULT POSITIVE STOOL 03/17/2007  . 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 HEMORRHOIDS, INTERNAL 01/20/2001  . DIVERTICULOSIS, COLON 01/20/2001   Past Medical History:  Diagnosis Date  . Allergy   . Gout   . Hyperlipidemia   . Hypertension     Family History  Problem Relation Age of Onset  . Colon cancer Mother 11  . Heart disease Father   . Diabetes Father   .  Heart Problems Paternal Grandfather     Past Surgical History:  Procedure Laterality Date  . APPENDECTOMY  1986  . CATARACT EXTRACTION     Social History   Occupational History  . Occupation: Retired  Tobacco Use  . Smoking status: Former Smoker    Last attempt to quit: 07/22/1987    Years since quitting: 30.1  . Smokeless tobacco: Never Used  Substance and Sexual Activity  . Alcohol use: Yes    Alcohol/week: 16.8 oz    Types: 28 Glasses of wine per week    Comment: daily  . Drug use: No  . Sexual activity: Not on file

## 2017-09-24 ENCOUNTER — Telehealth: Payer: Self-pay | Admitting: Physical Therapy

## 2017-09-24 ENCOUNTER — Ambulatory Visit: Payer: Medicare Other

## 2017-09-24 NOTE — Telephone Encounter (Signed)
Spoke to Mr Randy Moreno about progression and he appears to be appropriate in how he will progress golf and HEP He agreed to discharge

## 2017-09-28 ENCOUNTER — Ambulatory Visit: Payer: Medicare Other

## 2017-10-19 ENCOUNTER — Other Ambulatory Visit: Payer: Self-pay

## 2017-10-19 ENCOUNTER — Ambulatory Visit (AMBULATORY_SURGERY_CENTER): Payer: Self-pay

## 2017-10-19 VITALS — Ht 67.5 in | Wt 205.8 lb

## 2017-10-19 DIAGNOSIS — Z800 Family history of malignant neoplasm of digestive organs: Secondary | ICD-10-CM

## 2017-10-19 DIAGNOSIS — Z860100 Personal history of colon polyps, unspecified: Secondary | ICD-10-CM

## 2017-10-19 MED ORDER — NA SULFATE-K SULFATE-MG SULF 17.5-3.13-1.6 GM/177ML PO SOLN: 1.0000 | Freq: Once | ORAL | 0 refills | Status: AC

## 2017-10-19 NOTE — Progress Notes (Signed)
No egg or soy allergy known to patient  No issues with past sedation with any surgeries  or procedures, no intubation problems  No diet pills per patient No home 02 use per patient  No blood thinners per patient  Pt denies issues with constipation  No A fib or A flutter  EMMI video sent to pt's e mail , pt declined    

## 2017-10-21 ENCOUNTER — Encounter: Payer: Self-pay | Admitting: Internal Medicine

## 2017-11-02 ENCOUNTER — Encounter: Payer: Medicare Other | Admitting: Internal Medicine

## 2017-11-03 ENCOUNTER — Ambulatory Visit (AMBULATORY_SURGERY_CENTER): Payer: Medicare Other | Admitting: Internal Medicine

## 2017-11-03 ENCOUNTER — Encounter: Payer: Self-pay | Admitting: Internal Medicine

## 2017-11-03 VITALS — BP 99/76 | HR 63 | Temp 98.4°F | Resp 14 | Ht 67.0 in | Wt 195.0 lb

## 2017-11-03 DIAGNOSIS — Z86010 Personal history of colonic polyps: Secondary | ICD-10-CM | POA: Diagnosis not present

## 2017-11-03 DIAGNOSIS — D123 Benign neoplasm of transverse colon: Secondary | ICD-10-CM

## 2017-11-03 DIAGNOSIS — D125 Benign neoplasm of sigmoid colon: Secondary | ICD-10-CM

## 2017-11-03 DIAGNOSIS — K635 Polyp of colon: Secondary | ICD-10-CM

## 2017-11-03 MED ORDER — SODIUM CHLORIDE 0.9 % IV SOLN: 500.0000 mL | Freq: Once | INTRAVENOUS | Status: DC

## 2017-11-03 NOTE — Progress Notes (Signed)
Pt's states no medical or surgical changes since previsit or office visit. 

## 2017-11-03 NOTE — Progress Notes (Signed)
Report to PACU, RN, vss, BBS= Clear.  

## 2017-11-03 NOTE — Patient Instructions (Signed)
Please read handouts on polyps, diverticulosis, and hemorrhoids. Continue present medications.     YOU HAD AN ENDOSCOPIC PROCEDURE TODAY AT [facility]:   Refer to the procedure report that was given to you for any specific questions about what was found during the examination.  If the procedure report does not answer your questions, please call your gastroenterologist to clarify.  If you requested that your care partner not be given the details of your procedure findings, then the procedure report has been included in a sealed envelope for you to review at your convenience later.  YOU SHOULD EXPECT: Some feelings of bloating in the abdomen. Passage of more gas than usual.  Walking can help get rid of the air that was put into your GI tract during the procedure and reduce the bloating. If you had a lower endoscopy (such as a colonoscopy or flexible sigmoidoscopy) you may notice spotting of blood in your stool or on the toilet paper. If you underwent a bowel prep for your procedure, you may not have a normal bowel movement for a few days.  Please Note:  You might notice some irritation and congestion in your nose or some drainage.  This is from the oxygen used during your procedure.  There is no need for concern and it should clear up in a day or so.  SYMPTOMS TO REPORT IMMEDIATELY:   Following lower endoscopy (colonoscopy or flexible sigmoidoscopy):  Excessive amounts of blood in the stool  Significant tenderness or worsening of abdominal pains  Swelling of the abdomen that is new, acute  Fever of 100F or higher    For urgent or emergent issues, a gastroenterologist can be reached at any hour by calling [phone].   DIET:  We do recommend a small meal at first, but then you may proceed to your regular diet.  Drink plenty of fluids but you should avoid alcoholic beverages for 24 hours.  ACTIVITY:  You should plan to take it easy for the rest of today and you should  NOT DRIVE or use heavy machinery until tomorrow (because of the sedation medicines used during the test).    FOLLOW UP: Our staff will call the number listed on your records the next business day following your procedure to check on you and address any questions or concerns that you may have regarding the information given to you following your procedure. If we do not reach you, we will leave a message.  However, if you are feeling well and you are not experiencing any problems, there is no need to return our call.  We will assume that you have returned to your regular daily activities without incident.  If any biopsies were taken you will be contacted by phone or by letter within the next 1-3 weeks.  Please call us at [phone] if you have not heard about the biopsies in 3 weeks.    SIGNATURES/CONFIDENTIALITY: You and/or your care partner have signed paperwork which will be entered into your electronic medical record.  These signatures attest to the fact that that the information above on your After Visit Summary has been reviewed and is understood.  Full responsibility of the confidentiality of this discharge information lies with you and/or your care-partner.

## 2017-11-03 NOTE — Op Note (Signed)
[facility] Patient Name: Randy Moreno Procedure Date: 11/03/2017 8:18 AM MRN: [id number] Endoscopist: Docia Chuck. Henrene Pastor , MD Age: 72 Referring MD:  Date of Birth: 08/22/1945 Gender: Male Account #: [id number] Procedure:                Colonoscopy, with cold snare polypectomy x 2 Indications:              High risk colon cancer surveillance: Personal                            history of non-advanced adenomas. Previous                            examinations 2002, 2005, 2008, 2014. Parents with                            colon cancer. 2 uncles with colon cancer. Medicines:                Monitored Anesthesia Care Procedure:                Pre-Anesthesia Assessment:                           - Prior to the procedure, a History and Physical                            was performed, and patient medications and                            allergies were reviewed. The patient's tolerance of                            previous anesthesia was also reviewed. The risks                            and benefits of the procedure and the sedation                            options and risks were discussed with the patient.                            All questions were answered, and informed consent                            was obtained. Prior Anticoagulants: The patient has                            taken no previous anticoagulant or antiplatelet                            agents. ASA Grade Assessment: II - A patient with                            mild systemic disease. After reviewing the risks  and benefits, the patient was deemed in                            satisfactory condition to undergo the procedure.                           After obtaining informed consent, the colonoscope                            was passed under direct vision. Throughout the                            procedure, the patient's blood pressure, pulse, and   oxygen saturations were monitored continuously. The                            Model CF-HQ190L [id number] scope was introduced                            through the anus and advanced to the the cecum,                            identified by appendiceal orifice and ileocecal                            valve. The ileocecal valve, appendiceal orifice,                            and rectum were photographed. The quality of the                            bowel preparation was adequate to identify polyps.                            The colonoscopy was performed without difficulty.                            The patient tolerated the procedure well. The bowel                            preparation used was SUPREP. Scope In: 8:23:44 AM Scope Out: 8:39:45 AM Scope Withdrawal Time: 0 hours 12 minutes 20 seconds  Total Procedure Duration: 0 hours 16 minutes 1 second  Findings:                 Two polyps were found in the sigmoid colon and                            transverse colon. The polyps were 4 to 6 mm in                            size. These polyps were removed with a cold snare.  Resection and retrieval were complete.                           Multiple diverticula were found in the left colon                            and right colon.                           Internal hemorrhoids were found during retroflexion.                           The exam was otherwise without abnormality on                            direct and retroflexion views. Complications:            No immediate complications. Estimated blood loss:                            None. Estimated Blood Loss:     Estimated blood loss: none. Impression:               - Two 4 to 6 mm polyps in the sigmoid colon and in                            the transverse colon, removed with a cold snare.                            Resected and retrieved.                           - Diverticulosis in the left colon  and in the right                            colon.                           - Internal hemorrhoids.                           - The examination was otherwise normal on direct                            and retroflexion views. Recommendation:           - Repeat colonoscopy in 5 years for surveillance.                           - Patient has a contact number available for                            emergencies. The signs and symptoms of potential                            delayed complications were discussed with the  patient. Return to normal activities tomorrow.                            Written discharge instructions were provided to the                            patient.                           - Resume previous diet.                           - Continue present medications.                           - Await pathology results. Docia Chuck. Henrene Pastor, MD 11/03/2017 8:58:18 AM This report has been signed electronically.

## 2017-11-03 NOTE — Progress Notes (Signed)
Called to room to assist during endoscopic procedure.  Patient ID and intended procedure confirmed with present staff. Received instructions for my participation in the procedure from the performing physician.  

## 2017-11-04 ENCOUNTER — Telehealth: Payer: Self-pay

## 2017-11-04 NOTE — Telephone Encounter (Signed)
  Follow up Call-  Call back number 11/03/2017  Post procedure Call Back phone  # [phone]  Permission to leave phone message Yes  Some recent data might be hidden     Patient questions:  Do you have a fever, pain , or abdominal swelling? No. Pain Score  0 *  Have you tolerated food without any problems? Yes.    Have you been able to return to your normal activities? Yes.    Do you have any questions about your discharge instructions: Diet   No. Medications  No. Follow up visit  No.  Do you have questions or concerns about your Care? No.  Actions: * If pain score is 4 or above: No action needed, pain <4.

## 2017-11-06 ENCOUNTER — Encounter: Payer: Self-pay | Admitting: Internal Medicine

## 2018-07-30 ENCOUNTER — Other Ambulatory Visit: Payer: Self-pay

## 2018-07-30 ENCOUNTER — Encounter: Payer: Self-pay | Admitting: Orthopaedic Surgery

## 2018-07-30 ENCOUNTER — Ambulatory Visit: Payer: Medicare Other | Admitting: Orthopaedic Surgery

## 2018-07-30 VITALS — Ht 67.5 in | Wt 190.0 lb

## 2018-07-30 DIAGNOSIS — M25511 Pain in right shoulder: Secondary | ICD-10-CM | POA: Diagnosis not present

## 2018-07-30 DIAGNOSIS — Z9889 Other specified postprocedural states: Secondary | ICD-10-CM

## 2018-07-30 MED ORDER — BUPIVACAINE HCL 0.25 % IJ SOLN
4.0000 mL | INTRAMUSCULAR | Status: AC | PRN
  Administered 2018-07-30: 4 mL via INTRA_ARTICULAR

## 2018-07-30 MED ORDER — METHYLPREDNISOLONE ACETATE 40 MG/ML IJ SUSP
40.0000 mg | INTRAMUSCULAR | Status: AC | PRN
  Administered 2018-07-30: 40 mg via INTRA_ARTICULAR

## 2018-07-30 MED ORDER — LIDOCAINE HCL 1 % IJ SOLN
0.5000 mL | INTRAMUSCULAR | Status: AC | PRN
  Administered 2018-07-30: .5 mL

## 2018-07-30 NOTE — Progress Notes (Signed)
Office Visit Note   Patient: Randy Moreno           Date of Birth: 1945/11/10           MRN: [id number] Visit Date: 07/30/2018              Requested by: Crist Infante, MD [address] [place], [place] [address] PCP: Crist Infante, MD   Assessment & Plan: Visit Diagnoses:  1. Status post right rotator cuff repair     Plan: We discussed with patient he may have some recurrent rotator cuff tearing due to poor tendon quality.  We will try a single subacromial injection and see if it gives him some relief.  He can return if he has persistent problems.  We also discussed potential for repeat MRI but if he had recurrent tearing that repeat repair does not do as well as a first repair.post injection he had good pain relief.   Follow-Up Instructions: No follow-ups on file.   Orders:  Orders Placed This Encounter  Procedures  . Large Joint Inj   No orders of the defined types were placed in this encounter.     Procedures: Large Joint Inj on 07/30/2018 11:27 AM Indications: pain Details: 22 G 1.5 in needle  Arthrogram: No  Medications: 4 mL bupivacaine 0.25 %; 40 mg methylPREDNISolone acetate 40 MG/ML; 0.5 mL lidocaine 1 % Outcome: tolerated well, no immediate complications Procedure, treatment alternatives, risks and benefits explained, specific risks discussed. Consent was given by the patient. Immediately prior to procedure a time out was called to verify the correct patient, procedure, equipment, support staff and site/side marked as required. Patient was prepped and draped in the usual sterile fashion.       Clinical Data: No additional findings.   Subjective: Chief Complaint  Patient presents with  . Right Shoulder - Pain    05/18/2017 Right Shoulder Arthroscopy, Debridement    HPI 73 year old male returns post rotator cuff repair with findings of torn long head biceps.  He had some supraspinatus significant thinning with measures and repair was performed but there  was significant thinning of the tendon.  He is able to get his arm up over his head.  He swung a golf club play few holes with a wedge.  He states he has continued to use the bands for internal/external rotation strengthening but his shoulder still bothers him to some degree.  Review of Systems reviewed updated unchanged.   Objective: Vital Signs: Ht 5' 7.5" (1.715 m)   Wt 190 lb (86.2 kg)   BMI 29.32 kg/m   Physical Exam Constitutional:      Appearance: He is well-developed.  HENT:     Head: Normocephalic and atraumatic.  Eyes:     Pupils: Pupils are equal, round, and reactive to light.  Neck:     Thyroid: No thyromegaly.     Trachea: No tracheal deviation.  Cardiovascular:     Rate and Rhythm: Normal rate.  Pulmonary:     Effort: Pulmonary effort is normal.     Breath sounds: No wheezing.  Abdominal:     General: Bowel sounds are normal.     Palpations: Abdomen is soft.  Skin:    General: Skin is warm and dry.     Capillary Refill: Capillary refill takes less than 2 seconds.  Neurological:     Mental Status: He is alert and oriented to person, place, and time.  Psychiatric:        Behavior:  Behavior normal.        Thought Content: Thought content normal.        Judgment: Judgment normal.     Ortho Exam patient has well-healed incision and arthroscopic portals.  Positive impingement of the shoulder.  Specialty Comments:  No specialty comments available.  Imaging: No results found.   PMFS History: Patient Active Problem List   Diagnosis Date Noted  . Status post right rotator cuff repair 09/23/2017  . Peripheral neuropathy 01/17/2013  . HYPERTENSION 03/17/2007  . HEMOCCULT POSITIVE STOOL 03/17/2007  . 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 HEMORRHOIDS, INTERNAL 01/20/2001  . DIVERTICULOSIS, COLON 01/20/2001   Past Medical History:  Diagnosis Date  . Allergy   . Arthritis    neck  . Cataract   . Gout   . Hyperlipidemia   . Hypertension      Family History  Problem Relation Age of Onset  . Colon cancer Mother 63  . Heart disease Father   . Diabetes Father   . Heart Problems Paternal Grandfather   . Esophageal cancer Neg Hx   . Rectal cancer Neg Hx   . Stomach cancer Neg Hx     Past Surgical History:  Procedure Laterality Date  . APPENDECTOMY  1986  . CATARACT EXTRACTION    . COLONOSCOPY    . POLYPECTOMY    . ROTATOR CUFF REPAIR Right    march 4    Social History   Occupational History  . Occupation: Retired  Tobacco Use  . Smoking status: Former Smoker    Last attempt to quit: 07/22/1987    Years since quitting: 31.0  . Smokeless tobacco: Never Used  Substance and Sexual Activity  . Alcohol use: Yes    Alcohol/week: 28.0 standard drinks    Types: 28 Glasses of wine per week    Comment: daily  . Drug use: No  . Sexual activity: Not on file

## 2018-09-21 ENCOUNTER — Encounter: Payer: Self-pay | Admitting: Orthopaedic Surgery

## 2018-09-21 ENCOUNTER — Ambulatory Visit (INDEPENDENT_AMBULATORY_CARE_PROVIDER_SITE_OTHER): Payer: Medicare Other | Admitting: Orthopaedic Surgery

## 2018-09-21 ENCOUNTER — Ambulatory Visit: Payer: Self-pay

## 2018-09-21 ENCOUNTER — Other Ambulatory Visit: Payer: Self-pay

## 2018-09-21 VITALS — Ht 67.5 in | Wt 190.0 lb

## 2018-09-21 DIAGNOSIS — Z9889 Other specified postprocedural states: Secondary | ICD-10-CM | POA: Diagnosis not present

## 2018-09-21 DIAGNOSIS — M25511 Pain in right shoulder: Secondary | ICD-10-CM

## 2018-09-21 MED ORDER — LIDOCAINE HCL 1 % IJ SOLN
0.5000 mL | INTRAMUSCULAR | Status: AC | PRN
  Administered 2018-09-21: .5 mL

## 2018-09-21 MED ORDER — BUPIVACAINE HCL 0.25 % IJ SOLN
4.0000 mL | INTRAMUSCULAR | Status: AC | PRN
  Administered 2018-09-21: 4 mL via INTRA_ARTICULAR

## 2018-09-21 MED ORDER — METHYLPREDNISOLONE ACETATE 40 MG/ML IJ SUSP
40.0000 mg | INTRAMUSCULAR | Status: AC | PRN
  Administered 2018-09-21: 40 mg via INTRA_ARTICULAR

## 2018-09-21 NOTE — Progress Notes (Signed)
Office Visit Note   Patient: Randy Moreno           Date of Birth: 03/10/1946           MRN: [id number] Visit Date: 09/21/2018              Requested by: Crist Infante, MD [address] [place],  [place] [address] PCP: Crist Infante, MD   Assessment & Plan: Visit Diagnoses:  1. S/P right rotator cuff repair     Plan: Repeat subacromial injection performed which he tolerated well.  Good range of motion postinjection without pain.  We discussed with him that he may have some recurrent rotator cuff tearing.  He had tendinopathy of other areas but certainly had worse changes on his left shoulder than right with more retraction on the left shoulder with significant tendinosis of the infraspinatus severe tendinosis of the subscap.  And 3.6 cm retracted supraspinatus tear left shoulder.  We will see how he does with the injection if he still having problems he will call and we can repeat MRI scan right shoulder to rule out recurrent rotator cuff tear.  Follow-Up Instructions: No follow-ups on file.   Orders:  Orders Placed This Encounter  Procedures  . XR Shoulder Right   No orders of the defined types were placed in this encounter.     Procedures: Large Joint Inj: R subacromial bursa on 09/21/2018 9:52 AM Indications: pain Details: 22 G 1.5 in needle  Arthrogram: No  Medications: 4 mL bupivacaine 0.25 %; 40 mg methylPREDNISolone acetate 40 MG/ML; 0.5 mL lidocaine 1 % Outcome: tolerated well, no immediate complications Procedure, treatment alternatives, risks and benefits explained, specific risks discussed. Consent was given by the patient. Immediately prior to procedure a time out was called to verify the correct patient, procedure, equipment, support staff and site/side marked as required. Patient was prepped and draped in the usual sterile fashion.       Clinical Data: No additional findings.   Subjective: Chief Complaint  Patient presents with  . Right Shoulder -  Follow-up    HPI 73 year old male returns with persistent problems with his right shoulder.  He had rotator cuff repair 05/18/2017 and still has pain with abduction of the shoulder but can get his arm up over his head if he comes up in flexion position.  He does well if he uses his hand down in front of him at waist level.  He denies significant associated neck discomfort.  Occasionally with rotation of of his neck he notes clicking or popping.  No numbness or tingling in his hand.  He does have significant rotator cuff problems with his left shoulder as well.  At the time of surgery patient had a retracted tear several centimeters but did not have significant fatty atrophy.  His principal problem is pain at night when the injection and may he states he had 3 or 4 weeks of good relief.  He did extensive physical therapy and and also work with the bands after therapy was stopped.  Review of Systems 14 system update unchanged from previous notation March 2019 at the time of surgery other than as mentioned in HPI.   Objective: Vital Signs: Ht 5' 7.5" (1.715 m)   Wt 190 lb (86.2 kg)   BMI 29.32 kg/m   Physical Exam Constitutional:      Appearance: He is well-developed.  HENT:     Head: Normocephalic and atraumatic.  Eyes:     Pupils: Pupils  are equal, round, and reactive to light.  Neck:     Thyroid: No thyromegaly.     Trachea: No tracheal deviation.  Cardiovascular:     Rate and Rhythm: Normal rate.  Pulmonary:     Effort: Pulmonary effort is normal.     Breath sounds: No wheezing.  Abdominal:     General: Bowel sounds are normal.     Palpations: Abdomen is soft.  Skin:    General: Skin is warm and dry.     Capillary Refill: Capillary refill takes less than 2 seconds.  Neurological:     Mental Status: He is alert and oriented to person, place, and time.  Psychiatric:        Behavior: Behavior normal.        Thought Content: Thought content normal.        Judgment: Judgment normal.      Ortho Exam well-healed rotator cuff incision anterior and posterior portals.  He has some is pain with impingement test on the right.  He can get his arm up over his head if he brings it up in flexion.  Positive drop arm test left shoulder.  Sensation of the hand is intact right and left.  Specialty Comments:  No specialty comments available.  Imaging: No results found.   PMFS History: Patient Active Problem List   Diagnosis Date Noted  . Status post right rotator cuff repair 09/23/2017  . Peripheral neuropathy 01/17/2013  . HYPERTENSION 03/17/2007  . HEMOCCULT POSITIVE STOOL 03/17/2007  . 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 HEMORRHOIDS, INTERNAL 01/20/2001  . DIVERTICULOSIS, COLON 01/20/2001   Past Medical History:  Diagnosis Date  . Allergy   . Arthritis    neck  . Cataract   . Gout   . Hyperlipidemia   . Hypertension     Family History  Problem Relation Age of Onset  . Colon cancer Mother 69  . Heart disease Father   . Diabetes Father   . Heart Problems Paternal Grandfather   . Esophageal cancer Neg Hx   . Rectal cancer Neg Hx   . Stomach cancer Neg Hx     Past Surgical History:  Procedure Laterality Date  . APPENDECTOMY  1986  . CATARACT EXTRACTION    . COLONOSCOPY    . POLYPECTOMY    . ROTATOR CUFF REPAIR Right    march 4    Social History   Occupational History  . Occupation: Retired  Tobacco Use  . Smoking status: Former Smoker    Quit date: 07/22/1987    Years since quitting: 31.1  . Smokeless tobacco: Never Used  Substance and Sexual Activity  . Alcohol use: Yes    Alcohol/week: 28.0 standard drinks    Types: 28 Glasses of wine per week    Comment: daily  . Drug use: No  . Sexual activity: Not on file

## 2019-04-16 ENCOUNTER — Ambulatory Visit: Payer: Medicare Other

## 2019-04-18 ENCOUNTER — Other Ambulatory Visit: Payer: Self-pay | Admitting: Orthopedic Surgery

## 2019-04-18 DIAGNOSIS — G8929 Other chronic pain: Secondary | ICD-10-CM

## 2019-04-18 DIAGNOSIS — M25511 Pain in right shoulder: Secondary | ICD-10-CM

## 2019-04-23 IMAGING — CR DG HAND COMPLETE 3+V*R*
3 series · 3 of 3 positions shown · non-contrast
Comparison: 01/22/2015 right hand series.

CLINICAL DATA: 71-year-old male status post dog bite 4 days ago.
Progressive redness and swelling since yesterday.

EXAM:
RIGHT HAND - COMPLETE 3+ VIEW

[x hand pa right]
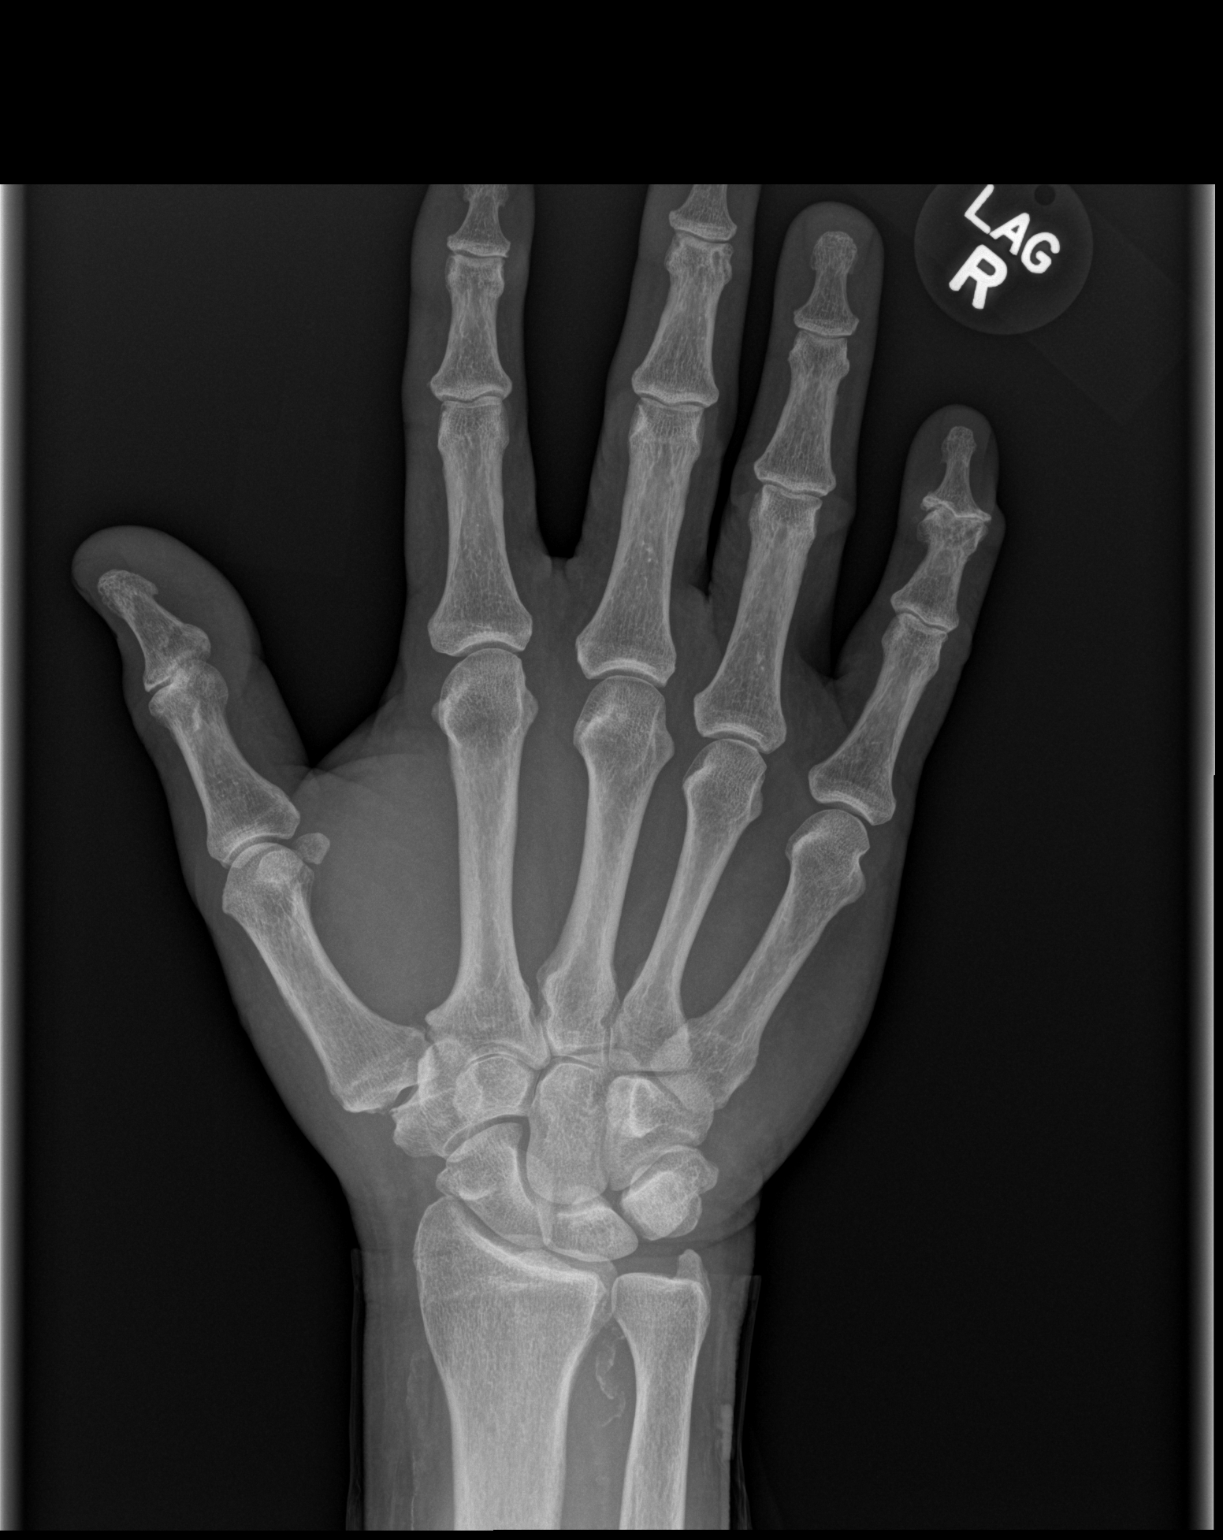

[x hand obl right]
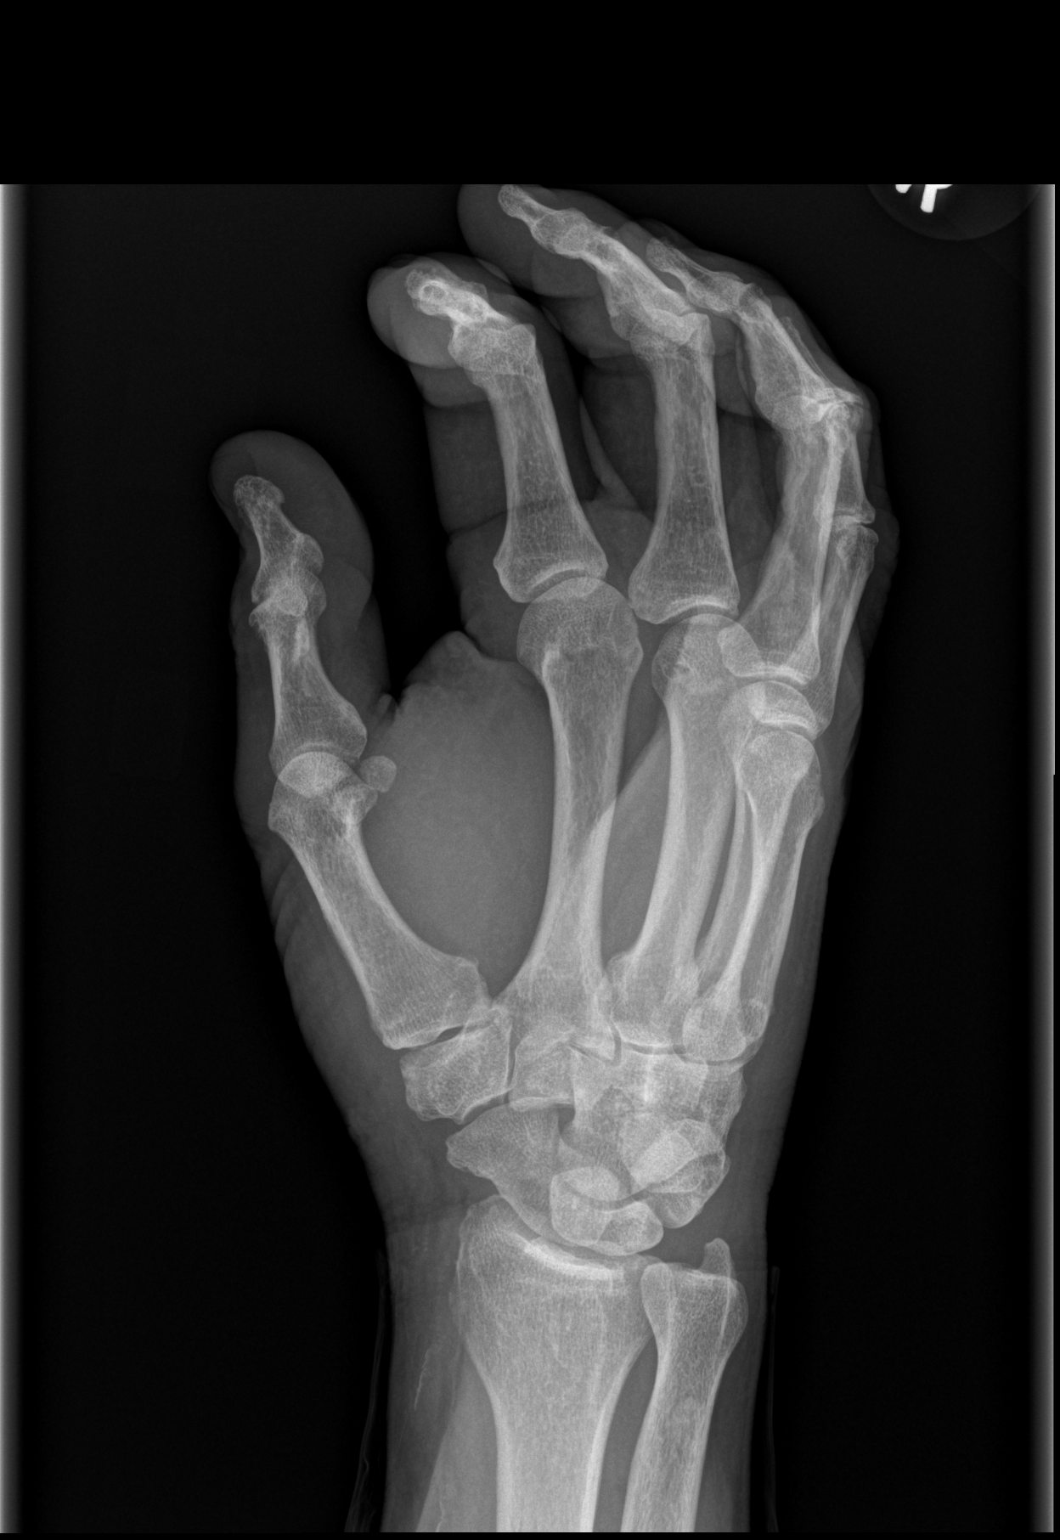

[x hand lat right]
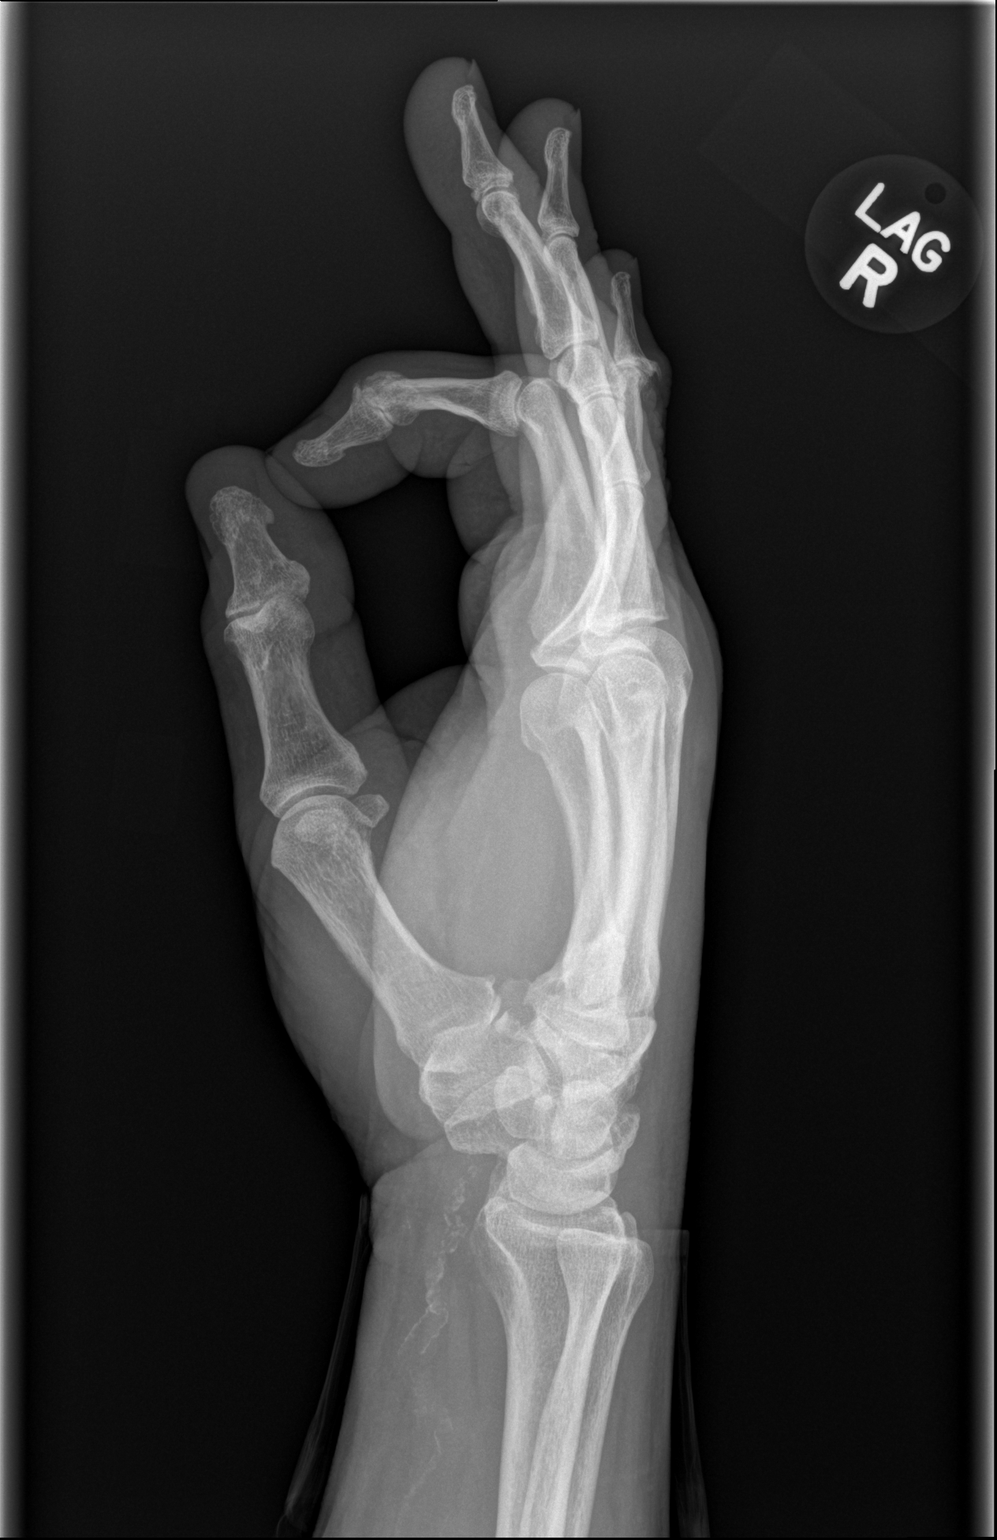

[3 of 3 positions shown; findings below may reference images not displayed]

FINDINGS: Calcified peripheral vascular disease at the wrist. Soft tissue
swelling is evident about the palm and hypothenar eminence. No
subcutaneous gas identified. No radiopaque foreign body identified.

Distal radius and ulna appear stable and intact. Carpal bone
alignment is stable and within normal limits. Metacarpals and
phalanges appear intact. No acute osseous abnormality identified.
Chronic distal joint space osteoarthritis, maximal at the 5th DIP.
IMPRESSION: 1. Soft tissue swelling with no soft tissue gas or radiopaque
foreign body identified.
2.  No acute osseous abnormality identified.
3. Calcified peripheral vascular disease.

## 2019-04-24 ENCOUNTER — Ambulatory Visit: Payer: Medicare Other | Attending: Internal Medicine

## 2019-04-24 DIAGNOSIS — Z23 Encounter for immunization: Secondary | ICD-10-CM | POA: Insufficient documentation

## 2019-04-24 NOTE — Progress Notes (Signed)
   Covid-19 Vaccination Clinic  Name:  Randy Moreno    MRN: [id number] DOB: 07-Jul-1945  04/24/2019  Randy Moreno was observed post Covid-19 immunization for 15 minutes without incidence. He was provided with Vaccine Information Sheet and instruction to access the V-Safe system.   Randy Moreno was instructed to call 911 with any severe reactions post vaccine: Marland Kitchen Difficulty breathing  . Swelling of your face and throat  . A fast heartbeat  . A bad rash all over your body  . Dizziness and weakness    Immunizations Administered    Name Date Dose VIS Date Route   Pfizer COVID-19 Vaccine 04/24/2019  8:39 AM 0.3 mL 02/25/2019 Intramuscular   Manufacturer: [place]   Lot: [id number]   [place]: [id number]

## 2019-05-07 ENCOUNTER — Ambulatory Visit: Payer: Medicare Other

## 2019-05-07 ENCOUNTER — Ambulatory Visit
Admission: RE | Admit: 2019-05-07 | Discharge: 2019-05-07 | Disposition: A | Discharge status: Home / Self Care | Payer: Medicare Other | Source: Ambulatory Visit | Attending: Orthopedic Surgery | Admitting: Orthopedic Surgery

## 2019-05-07 DIAGNOSIS — G8929 Other chronic pain: Secondary | ICD-10-CM

## 2019-05-18 ENCOUNTER — Ambulatory Visit: Payer: Medicare Other | Attending: Internal Medicine

## 2019-05-18 DIAGNOSIS — Z23 Encounter for immunization: Secondary | ICD-10-CM | POA: Insufficient documentation

## 2019-05-18 NOTE — Progress Notes (Signed)
   Covid-19 Vaccination Clinic  Name:  Randy Moreno    MRN: [id number] DOB: 1945-04-02  05/18/2019  [name] was observed post Covid-19 immunization for 15 minutes without incident. He was provided with Vaccine Information Sheet and instruction to access the V-Safe system.   [name] was instructed to call 911 with any severe reactions post vaccine: Marland Kitchen Difficulty breathing  . Swelling of face and throat  . A fast heartbeat  . A bad rash all over body  . Dizziness and weakness   Immunizations Administered    Name Date Dose VIS Date Route   Pfizer COVID-19 Vaccine 05/18/2019  2:41 PM 0.3 mL 02/25/2019 Intramuscular   Manufacturer: [place]   Lot: [id number]   [place]: [id number]

## 2019-07-15 ENCOUNTER — Other Ambulatory Visit: Payer: Self-pay | Admitting: Orthopedic Surgery

## 2019-07-20 NOTE — Patient Instructions (Addendum)
DUE TO COVID-19 ONLY ONE VISITOR IS ALLOWED TO COME WITH YOU AND STAY IN THE WAITING ROOM ONLY DURING PRE OP AND PROCEDURE DAY OF SURGERY. THE 1 VISITOR MAY VISIT WITH YOU AFTER SURGERY IN YOUR PRIVATE ROOM DURING VISITING HOURS ONLY!  YOU NEED TO HAVE A COVID 19 TEST ON: 07/25/19 @ 10:35 am  , THIS TEST MUST BE DONE BEFORE SURGERY, COME  [place], [address] , [id number].  ([place]) ONCE YOUR COVID TEST IS COMPLETED, PLEASE BEGIN THE QUARANTINE INSTRUCTIONS AS OUTLINED IN YOUR HANDOUT.                [name]   Your procedure is scheduled on: 07/28/19   Report to [facility] Main  Entrance   Report to admitting at: 11:00 AM     Call this number if you have problems the morning of surgery [phone]    Remember:  NO SOLID FOOD AFTER MIDNIGHT THE NIGHT PRIOR TO SURGERY. NOTHING BY MOUTH EXCEPT CLEAR LIQUIDS UNTIL: 10:30 am . PLEASE FINISH ENSURE DRINK PER SURGEON ORDER  WHICH NEEDS TO BE COMPLETED AT: 10:30 am .   CLEAR LIQUID DIET   Foods Allowed                                                                     Foods Excluded  Coffee and tea, regular and decaf                             liquids that you cannot  Plain Jell-O any favor except red or purple                                           see through such as: Fruit ices (not with fruit pulp)                                     milk, soups, orange juice  Iced Popsicles                                    All solid food Carbonated beverages, regular and diet                                    Cranberry, grape and apple juices Sports drinks like Gatorade Lightly seasoned clear broth or consume(fat free) Sugar, honey syrup  Sample Menu Breakfast                                Lunch                                     Supper Cranberry juice  Beef broth                            Chicken broth Jell-O                                     Grape juice                            Apple juice Coffee or tea                        Jell-O                                      Popsicle                                                Coffee or tea                        Coffee or tea  _____________________________________________________________________   BRUSH YOUR TEETH MORNING OF SURGERY AND RINSE YOUR MOUTH OUT, NO CHEWING GUM CANDY OR MINTS.     Take these medicines the morning of surgery with A SIP OF WATER: amlodipine,escitalopram.                                You may not have any metal on your body including hair pins and              piercings  Do not wear jewelry, lotions, powders or perfumes, deodorant             Men may shave face and neck.   Do not bring valuables to the hospital. [place].  Contacts, dentures or bridgework may not be worn into surgery.  Leave suitcase in the car. After surgery it may be brought to your room.     Patients discharged the day of surgery will not be allowed to drive home. IF YOU ARE HAVING SURGERY AND GOING HOME THE SAME DAY, YOU MUST HAVE AN ADULT TO DRIVE YOU HOME AND BE WITH YOU FOR 24 HOURS. YOU MAY GO HOME BY TAXI OR UBER OR ORTHERWISE, BUT AN ADULT MUST ACCOMPANY YOU HOME AND STAY WITH YOU FOR 24 HOURS.  Name and phone number of your driver:  Special Instructions: N/A              Please read over the following fact sheets you were given: _____________________________________________________________________             [facility]- Preparing for Total Shoulder Arthroplasty    Before surgery, you can play an important role. Because skin is not sterile, your skin needs to be as free of germs as possible. You can reduce the number of germs on your skin by using the following products. . Benzoyl Peroxide Gel o Reduces the number of germs present on the skin o  Applied twice a day to shoulder area starting two days before surgery     ==================================================================  Please follow these instructions carefully:  BENZOYL PEROXIDE 5% GEL  Please do not use if you have an allergy to benzoyl peroxide.   If your skin becomes reddened/irritated stop using the benzoyl peroxide.  Starting two days before surgery, apply as follows: 1. Apply benzoyl peroxide in the morning and at night. Apply after taking a shower. If you are not taking a shower clean entire shoulder front, back, and side along with the armpit with a clean wet washcloth.  2. Place a quarter-sized dollop on your shoulder and rub in thoroughly, making sure to cover the front, back, and side of your shoulder, along with the armpit.   2 days before ____ AM   ____ PM              1 day before ____ AM   ____ PM                         3. Do this twice a day for two days.  (Last application is the night before surgery, AFTER using the CHG soap as described below).  4. Do NOT apply benzoyl peroxide gel on the day of surgery.  [facility] - Preparing for Surgery Before surgery, you can play an important role.  Because skin is not sterile, your skin needs to be as free of germs as possible.  You can reduce the number of germs on your skin by washing with CHG (chlorahexidine gluconate) soap before surgery.  CHG is an antiseptic cleaner which kills germs and bonds with the skin to continue killing germs even after washing. Please DO NOT use if you have an allergy to CHG or antibacterial soaps.  If your skin becomes reddened/irritated stop using the CHG and inform your nurse when you arrive at Short Stay. Do not shave (including legs and underarms) for at least 48 hours prior to the first CHG shower.  You may shave your face/neck. Please follow these instructions carefully:  1.  Shower with CHG Soap the night before surgery and the  morning of Surgery.  2.  If you choose to wash your hair, wash your hair first as usual with your  normal   shampoo.  3.  After you shampoo, rinse your hair and body thoroughly to remove the  shampoo.                           4.  Use CHG as you would any other liquid soap.  You can apply chg directly  to the skin and wash                       Gently with a scrungie or clean washcloth.  5.  Apply the CHG Soap to your body ONLY FROM THE NECK DOWN.   Do not use on face/ open                           Wound or open sores. Avoid contact with eyes, ears mouth and genitals (private parts).                       Wash face,  Genitals (private parts) with your normal soap.  6.  Wash thoroughly, paying special attention to the area where your surgery  will be performed.  7.  Thoroughly rinse your body with warm water from the neck down.  8.  DO NOT shower/wash with your normal soap after using and rinsing off  the CHG Soap.                9.  Pat yourself dry with a clean towel.            10.  Wear clean pajamas.            11.  Place clean sheets on your bed the night of your first shower and do not  sleep with pets. Day of Surgery : Do not apply any lotions/deodorants the morning of surgery.  Please wear clean clothes to the hospital/surgery center.  FAILURE TO FOLLOW THESE INSTRUCTIONS MAY RESULT IN THE CANCELLATION OF YOUR SURGERY PATIENT SIGNATURE_________________________________  NURSE SIGNATURE__________________________________  ________________________________________________________________________   Randy Moreno  An incentive spirometer is a tool that can help keep your lungs clear and active. This tool measures how well you are filling your lungs with each breath. Taking long deep breaths may help reverse or decrease the chance of developing breathing (pulmonary) problems (especially infection) following:  A long period of time when you are unable to move or be active. BEFORE THE PROCEDURE   If the spirometer includes an indicator to show your best effort, your nurse or  respiratory therapist will set it to a desired goal.  If possible, sit up straight or lean slightly forward. Try not to slouch.  Hold the incentive spirometer in an upright position. INSTRUCTIONS FOR USE  1. Sit on the edge of your bed if possible, or sit up as far as you can in bed or on a chair. 2. Hold the incentive spirometer in an upright position. 3. Breathe out normally. 4. Place the mouthpiece in your mouth and seal your lips tightly around it. 5. Breathe in slowly and as deeply as possible, raising the piston or the ball toward the top of the column. 6. Hold your breath for 3-5 seconds or for as long as possible. Allow the piston or ball to fall to the bottom of the column. 7. Remove the mouthpiece from your mouth and breathe out normally. 8. Rest for a few seconds and repeat Steps 1 through 7 at least 10 times every 1-2 hours when you are awake. Take your time and take a few normal breaths between deep breaths. 9. The spirometer may include an indicator to show your best effort. Use the indicator as a goal to work toward during each repetition. 10. After each set of 10 deep breaths, practice coughing to be sure your lungs are clear. If you have an incision (the cut made at the time of surgery), support your incision when coughing by placing a pillow or rolled up towels firmly against it. Once you are able to get out of bed, walk around indoors and cough well. You may stop using the incentive spirometer when instructed by your caregiver.  RISKS AND COMPLICATIONS  Take your time so you do not get dizzy or light-headed.  If you are in pain, you may need to take or ask for pain medication before doing incentive spirometry. It is harder to take a deep breath if you are having pain. AFTER USE  Rest and breathe slowly and easily.  It can be helpful to keep track of a log of your  progress. Your caregiver can provide you with a simple table to help with this. If you are using the  spirometer at home, follow these instructions: [place] IF:   You are having difficultly using the spirometer.  You have trouble using the spirometer as often as instructed.  Your pain medication is not giving enough relief while using the spirometer.  You develop fever of 100.5 F (38.1 C) or higher. SEEK IMMEDIATE MEDICAL CARE IF:   You cough up bloody sputum that had not been present before.  You develop fever of 102 F (38.9 C) or greater.  You develop worsening pain at or near the incision site. MAKE SURE YOU:   Understand these instructions.  Will watch your condition.  Will get help right away if you are not doing well or get worse. Document Released: 07/14/2006 Document Revised: 05/26/2011 Document Reviewed: 09/14/2006 [facility] Patient Information 2014 [place], [place].   ________________________________________________________________________

## 2019-07-21 ENCOUNTER — Other Ambulatory Visit: Payer: Self-pay

## 2019-07-21 ENCOUNTER — Encounter (HOSPITAL_COMMUNITY)
Admission: RE | Admit: 2019-07-21 | Discharge: 2019-07-21 | Disposition: A | Discharge status: Home / Self Care | Payer: Medicare Other | Source: Ambulatory Visit | Attending: Orthopedic Surgery | Admitting: Orthopedic Surgery

## 2019-07-21 ENCOUNTER — Encounter (HOSPITAL_COMMUNITY): Payer: Self-pay

## 2019-07-21 ENCOUNTER — Ambulatory Visit (HOSPITAL_COMMUNITY)
Admission: RE | Admit: 2019-07-21 | Discharge: 2019-07-21 | Disposition: A | Discharge status: Home / Self Care | Payer: Medicare Other | Source: Ambulatory Visit | Attending: Orthopedic Surgery | Admitting: Orthopedic Surgery

## 2019-07-21 DIAGNOSIS — Z01818 Encounter for other preprocedural examination: Secondary | ICD-10-CM | POA: Insufficient documentation

## 2019-07-21 DIAGNOSIS — I4510 Unspecified right bundle-branch block: Secondary | ICD-10-CM | POA: Diagnosis not present

## 2019-07-21 DIAGNOSIS — Z01811 Encounter for preprocedural respiratory examination: Secondary | ICD-10-CM | POA: Diagnosis not present

## 2019-07-21 LAB — CBC WITH DIFFERENTIAL/PLATELET
Abs Immature Granulocytes: 0.02 10*3/uL (ref 0.00–0.07)
Basophils Absolute: 0 10*3/uL (ref 0.0–0.1)
Basophils Relative: 1 %
Eosinophils Absolute: 0.2 10*3/uL (ref 0.0–0.5)
Eosinophils Relative: 3 %
HCT: 44.9 % (ref 39.0–52.0)
Hemoglobin: 15.4 g/dL (ref 13.0–17.0)
Immature Granulocytes: 0 %
Lymphocytes Relative: 50 %
Lymphs Abs: 3.2 10*3/uL (ref 0.7–4.0)
MCH: 33.2 pg (ref 26.0–34.0)
MCHC: 34.3 g/dL (ref 30.0–36.0)
MCV: 96.8 fL (ref 80.0–100.0)
Monocytes Absolute: 0.8 10*3/uL (ref 0.1–1.0)
Monocytes Relative: 13 %
Neutro Abs: 2.1 10*3/uL (ref 1.7–7.7)
Neutrophils Relative %: 33 %
Platelets: 167 10*3/uL (ref 150–400)
RBC: 4.64 MIL/uL (ref 4.22–5.81)
RDW: 13.1 % (ref 11.5–15.5)
WBC: 6.3 10*3/uL (ref 4.0–10.5)
nRBC: 0 % (ref 0.0–0.2)

## 2019-07-21 LAB — COMPREHENSIVE METABOLIC PANEL
ALT: 25 U/L (ref 0–44)
AST: 30 U/L (ref 15–41)
Albumin: 4.2 g/dL (ref 3.5–5.0)
Alkaline Phosphatase: 83 U/L (ref 38–126)
Anion gap: 10 (ref 5–15)
BUN: 18 mg/dL (ref 8–23)
CO2: 29 mmol/L (ref 22–32)
Calcium: 9.4 mg/dL (ref 8.9–10.3)
Chloride: 101 mmol/L (ref 98–111)
Creatinine, Ser: 1.11 mg/dL (ref 0.61–1.24)
GFR calc Af Amer: >60 mL/min (ref >60)
GFR calc non Af Amer: >60 mL/min (ref >60)
Glucose, Bld: 118 mg/dL — ABNORMAL HIGH (ref 70–99)
Potassium: 3.6 mmol/L (ref 3.5–5.1)
Sodium: 140 mmol/L (ref 135–145)
Total Bilirubin: 1.1 mg/dL (ref 0.3–1.2)
Total Protein: 7 g/dL (ref 6.5–8.1)

## 2019-07-21 LAB — URINALYSIS, ROUTINE W REFLEX MICROSCOPIC
Bilirubin Urine: NEGATIVE
Glucose, UA: NEGATIVE mg/dL
Hgb urine dipstick: NEGATIVE
Ketones, ur: NEGATIVE mg/dL
Leukocytes,Ua: NEGATIVE
Nitrite: NEGATIVE
Protein, ur: NEGATIVE mg/dL
Specific Gravity, Urine: 1.021 (ref 1.005–1.030)
pH: 6 (ref 5.0–8.0)

## 2019-07-21 LAB — SURGICAL PCR SCREEN
MRSA, PCR: NEGATIVE
Staphylococcus aureus: POSITIVE — AB

## 2019-07-21 LAB — PROTIME-INR
INR: 1 (ref 0.8–1.2)
Prothrombin Time: 12.6 seconds (ref 11.4–15.2)

## 2019-07-21 LAB — APTT: aPTT: 29 seconds (ref 24–36)

## 2019-07-21 NOTE — Progress Notes (Signed)
PCP - Dr. Crist Infante. LOV: 09/21/18 Cardiologist -   Chest x-ray -  EKG -  Stress Test -  ECHO -  Cardiac Cath -   Sleep Study -  CPAP -   Fasting Blood Sugar -  Checks Blood Sugar _____ times a day  Blood Thinner Instructions: Aspirin Instructions: Last Dose:  Anesthesia review:   Patient denies shortness of breath, fever, cough and chest pain at PAT appointment   Patient verbalized understanding of instructions that were given to them at the PAT appointment. Patient was also instructed that they will need to review over the PAT instructions again at home before surgery.

## 2019-07-22 LAB — ABO/RH: ABO/RH(D): O POS

## 2019-07-22 NOTE — Progress Notes (Signed)
PCR results: positive for STAPH. 

## 2019-07-25 ENCOUNTER — Other Ambulatory Visit (HOSPITAL_COMMUNITY)
Admission: RE | Admit: 2019-07-25 | Discharge: 2019-07-25 | Disposition: A | Discharge status: Home / Self Care | Payer: Medicare Other | Source: Ambulatory Visit | Attending: Orthopedic Surgery | Admitting: Orthopedic Surgery

## 2019-07-25 DIAGNOSIS — Z20822 Contact with and (suspected) exposure to covid-19: Secondary | ICD-10-CM | POA: Diagnosis not present

## 2019-07-25 DIAGNOSIS — Z01812 Encounter for preprocedural laboratory examination: Secondary | ICD-10-CM | POA: Insufficient documentation

## 2019-07-25 LAB — SARS CORONAVIRUS 2 (TAT 6-24 HRS): SARS Coronavirus 2: NEGATIVE

## 2019-07-27 NOTE — Progress Notes (Signed)
Called patient to notify him of time change for surgery for 07/28/19. Patient to arrive 0730 for 1000 surgery. ERAS drink to be completed by 0700. Reviewed CHG showers and benzoin application prior to shoulder surgery. He verbalizes understanding.

## 2019-07-28 ENCOUNTER — Encounter (HOSPITAL_COMMUNITY): Payer: Self-pay | Admitting: Orthopedic Surgery

## 2019-07-28 ENCOUNTER — Other Ambulatory Visit: Payer: Self-pay

## 2019-07-28 ENCOUNTER — Encounter (HOSPITAL_COMMUNITY)
Admission: RE | Disposition: A | Discharge status: Home / Self Care | Payer: Self-pay | Source: Home / Self Care | Attending: Orthopedic Surgery

## 2019-07-28 ENCOUNTER — Ambulatory Visit (HOSPITAL_COMMUNITY): Payer: Medicare Other

## 2019-07-28 ENCOUNTER — Ambulatory Visit (HOSPITAL_COMMUNITY): Payer: Medicare Other | Admitting: Anesthesiology

## 2019-07-28 ENCOUNTER — Ambulatory Visit (HOSPITAL_COMMUNITY)
Admission: RE | Admit: 2019-07-28 | Discharge: 2019-07-28 | Disposition: A | Discharge status: Home / Self Care | Payer: Medicare Other | Attending: Orthopedic Surgery | Admitting: Orthopedic Surgery

## 2019-07-28 DIAGNOSIS — M75101 Unspecified rotator cuff tear or rupture of right shoulder, not specified as traumatic: Secondary | ICD-10-CM | POA: Insufficient documentation

## 2019-07-28 DIAGNOSIS — M109 Gout, unspecified: Secondary | ICD-10-CM | POA: Diagnosis not present

## 2019-07-28 DIAGNOSIS — Z9849 Cataract extraction status, unspecified eye: Secondary | ICD-10-CM | POA: Insufficient documentation

## 2019-07-28 DIAGNOSIS — Z800 Family history of malignant neoplasm of digestive organs: Secondary | ICD-10-CM | POA: Diagnosis not present

## 2019-07-28 DIAGNOSIS — Z833 Family history of diabetes mellitus: Secondary | ICD-10-CM | POA: Insufficient documentation

## 2019-07-28 DIAGNOSIS — Z8249 Family history of ischemic heart disease and other diseases of the circulatory system: Secondary | ICD-10-CM | POA: Diagnosis not present

## 2019-07-28 DIAGNOSIS — M19011 Primary osteoarthritis, right shoulder: Secondary | ICD-10-CM | POA: Diagnosis not present

## 2019-07-28 DIAGNOSIS — Z79899 Other long term (current) drug therapy: Secondary | ICD-10-CM | POA: Insufficient documentation

## 2019-07-28 DIAGNOSIS — M47812 Spondylosis without myelopathy or radiculopathy, cervical region: Secondary | ICD-10-CM | POA: Diagnosis not present

## 2019-07-28 DIAGNOSIS — E785 Hyperlipidemia, unspecified: Secondary | ICD-10-CM | POA: Diagnosis not present

## 2019-07-28 DIAGNOSIS — Z87891 Personal history of nicotine dependence: Secondary | ICD-10-CM | POA: Diagnosis not present

## 2019-07-28 DIAGNOSIS — I10 Essential (primary) hypertension: Secondary | ICD-10-CM | POA: Diagnosis not present

## 2019-07-28 DIAGNOSIS — Z96611 Presence of right artificial shoulder joint: Secondary | ICD-10-CM

## 2019-07-28 HISTORY — PX: TOTAL SHOULDER ARTHROPLASTY: SHX126

## 2019-07-28 LAB — TYPE AND SCREEN
ABO/RH(D): O POS
Antibody Screen: NEGATIVE

## 2019-07-28 SURGERY — ARTHROPLASTY, SHOULDER, TOTAL
Anesthesia: General | Site: Shoulder | Laterality: Right

## 2019-07-28 MED ORDER — HYDROMORPHONE HCL 1 MG/ML IJ SOLN
INTRAMUSCULAR | Status: AC
  Filled 2019-07-28: qty 1

## 2019-07-28 MED ORDER — FENTANYL CITRATE (PF) 100 MCG/2ML IJ SOLN
50.0000 ug | INTRAMUSCULAR | Status: DC
  Administered 2019-07-28: 100 ug via INTRAVENOUS

## 2019-07-28 MED ORDER — ORAL CARE MOUTH RINSE
15.0000 mL | Freq: Once | OROMUCOSAL | Status: AC
  Administered 2019-07-28: 15 mL via OROMUCOSAL

## 2019-07-28 MED ORDER — SUGAMMADEX SODIUM 500 MG/5ML IV SOLN
INTRAVENOUS | Status: AC
  Filled 2019-07-28: qty 5

## 2019-07-28 MED ORDER — GLYCOPYRROLATE PF 0.2 MG/ML IJ SOSY
PREFILLED_SYRINGE | INTRAMUSCULAR | Status: AC
  Filled 2019-07-28: qty 2

## 2019-07-28 MED ORDER — DEXAMETHASONE SODIUM PHOSPHATE 10 MG/ML IJ SOLN
INTRAMUSCULAR | Status: DC | PRN
  Administered 2019-07-28: 10 mg via INTRAVENOUS

## 2019-07-28 MED ORDER — ROCURONIUM BROMIDE 10 MG/ML (PF) SYRINGE
PREFILLED_SYRINGE | INTRAVENOUS | Status: DC | PRN
  Administered 2019-07-28: 100 mg via INTRAVENOUS

## 2019-07-28 MED ORDER — TIZANIDINE HCL 4 MG PO TABS
4.0000 mg | ORAL_TABLET | Freq: Three times a day (TID) | ORAL | 1 refills | Status: DC | PRN
Start: 2019-07-28 — End: 2019-12-02

## 2019-07-28 MED ORDER — ONDANSETRON HCL 4 MG/2ML IJ SOLN: 4.0000 mg | Freq: Once | INTRAMUSCULAR | Status: DC | PRN

## 2019-07-28 MED ORDER — PROPOFOL 10 MG/ML IV BOLUS
INTRAVENOUS | Status: DC | PRN
  Administered 2019-07-28: 180 mg via INTRAVENOUS

## 2019-07-28 MED ORDER — CHLORHEXIDINE GLUCONATE 0.12 % MT SOLN: 15.0000 mL | Freq: Once | OROMUCOSAL | Status: AC

## 2019-07-28 MED ORDER — 0.9 % SODIUM CHLORIDE (POUR BTL) OPTIME
TOPICAL | Status: DC | PRN
  Administered 2019-07-28: 1000 mL

## 2019-07-28 MED ORDER — FENTANYL CITRATE (PF) 100 MCG/2ML IJ SOLN
INTRAMUSCULAR | Status: AC
  Filled 2019-07-28: qty 2

## 2019-07-28 MED ORDER — TRANEXAMIC ACID-NACL 1000-0.7 MG/100ML-% IV SOLN
1000.0000 mg | INTRAVENOUS | Status: AC
  Administered 2019-07-28: 1000 mg via INTRAVENOUS
  Filled 2019-07-28: qty 100

## 2019-07-28 MED ORDER — BUPIVACAINE LIPOSOME 1.3 % IJ SUSP
INTRAMUSCULAR | Status: DC | PRN
  Administered 2019-07-28: 10 mL via PERINEURAL

## 2019-07-28 MED ORDER — OXYCODONE-ACETAMINOPHEN 5-325 MG PO TABS: ORAL_TABLET | ORAL | 0 refills | Status: DC

## 2019-07-28 MED ORDER — PHENYLEPHRINE HCL-NACL 10-0.9 MG/250ML-% IV SOLN
INTRAVENOUS | Status: DC | PRN
  Administered 2019-07-28: 70 ug/min via INTRAVENOUS

## 2019-07-28 MED ORDER — LACTATED RINGERS IV SOLN: INTRAVENOUS | Status: DC

## 2019-07-28 MED ORDER — PROPOFOL 10 MG/ML IV BOLUS
INTRAVENOUS | Status: AC
  Filled 2019-07-28: qty 20

## 2019-07-28 MED ORDER — SUGAMMADEX SODIUM 500 MG/5ML IV SOLN
INTRAVENOUS | Status: DC | PRN
  Administered 2019-07-28: 400 mg via INTRAVENOUS

## 2019-07-28 MED ORDER — MEPERIDINE HCL 50 MG/ML IJ SOLN: 6.2500 mg | INTRAMUSCULAR | Status: DC | PRN

## 2019-07-28 MED ORDER — WATER FOR IRRIGATION, STERILE IR SOLN
Status: DC | PRN
  Administered 2019-07-28: 1000 mL

## 2019-07-28 MED ORDER — BUPIVACAINE-EPINEPHRINE (PF) 0.5% -1:200000 IJ SOLN
INTRAMUSCULAR | Status: DC | PRN
Start: 2019-07-28 — End: 2019-07-28
  Administered 2019-07-28: 20 mL via PERINEURAL

## 2019-07-28 MED ORDER — LIDOCAINE 2% (20 MG/ML) 5 ML SYRINGE
INTRAMUSCULAR | Status: DC | PRN
  Administered 2019-07-28: 80 mg via INTRAVENOUS

## 2019-07-28 MED ORDER — GLYCOPYRROLATE PF 0.2 MG/ML IJ SOSY
PREFILLED_SYRINGE | INTRAMUSCULAR | Status: AC
  Filled 2019-07-28: qty 1

## 2019-07-28 MED ORDER — ONDANSETRON HCL 4 MG/2ML IJ SOLN
INTRAMUSCULAR | Status: DC | PRN
  Administered 2019-07-28: 4 mg via INTRAVENOUS

## 2019-07-28 MED ORDER — MIDAZOLAM HCL 2 MG/2ML IJ SOLN
1.0000 mg | INTRAMUSCULAR | Status: DC
  Administered 2019-07-28: 2 mg via INTRAVENOUS

## 2019-07-28 MED ORDER — CEFAZOLIN SODIUM-DEXTROSE 2-4 GM/100ML-% IV SOLN
2.0000 g | INTRAVENOUS | Status: AC
  Administered 2019-07-28: 2 g via INTRAVENOUS
  Filled 2019-07-28: qty 100

## 2019-07-28 MED ORDER — SODIUM CHLORIDE 0.9 % IR SOLN
Status: DC | PRN
  Administered 2019-07-28: 1000 mL

## 2019-07-28 MED ORDER — HYDROMORPHONE HCL 1 MG/ML IJ SOLN
0.2500 mg | INTRAMUSCULAR | Status: DC | PRN
  Administered 2019-07-28 (×4): 0.5 mg via INTRAVENOUS

## 2019-07-28 MED ORDER — MIDAZOLAM HCL 2 MG/2ML IJ SOLN
INTRAMUSCULAR | Status: AC
  Filled 2019-07-28: qty 2

## 2019-07-28 SURGICAL SUPPLY — 77 items
BAG ZIPLOCK 12X15 (MISCELLANEOUS) ×2 IMPLANT
BASEPLATE P2 COATD GLND 6.5X30 (Shoulder) IMPLANT
BIT DRILL 1.6MX128 (BIT) ×2 IMPLANT
BIT DRILL 2.5 DIA 127 CALI (BIT) ×1 IMPLANT
BIT DRILL 4 DIA CALIBRATED (BIT) ×1 IMPLANT
BLADE SAW SAG 73X25 THK (BLADE) ×1
BLADE SAW SGTL 73X25 THK (BLADE) ×1 IMPLANT
CLSR STERI-STRIP ANTIMIC 1/2X4 (GAUZE/BANDAGES/DRESSINGS) ×1 IMPLANT
COOLER ICEMAN CLASSIC (MISCELLANEOUS) ×1 IMPLANT
COVER BACK TABLE 60X90IN (DRAPES) ×2 IMPLANT
COVER SURGICAL LIGHT HANDLE (MISCELLANEOUS) ×2 IMPLANT
COVER WAND RF STERILE (DRAPES) ×1 IMPLANT
DRAPE INCISE IOBAN 66X45 STRL (DRAPES) ×2 IMPLANT
DRAPE ORTHO SPLIT 77X108 STRL (DRAPES)
DRAPE POUCH INSTRU U-SHP 10X18 (DRAPES) ×2 IMPLANT
DRAPE SHEET LG 3/4 BI-LAMINATE (DRAPES) ×4 IMPLANT
DRAPE SURG 17X11 SM STRL (DRAPES) ×2 IMPLANT
DRAPE SURG ORHT 6 SPLT 77X108 (DRAPES) IMPLANT
DRAPE U-SHAPE 47X51 STRL (DRAPES) ×2 IMPLANT
DRSG AQUACEL AG ADV 3.5X 6 (GAUZE/BANDAGES/DRESSINGS) ×2 IMPLANT
DURAPREP 26ML APPLICATOR (WOUND CARE) ×2 IMPLANT
ELECT BLADE TIP CTD 4 INCH (ELECTRODE) ×2 IMPLANT
ELECT REM PT RETURN 15FT ADLT (MISCELLANEOUS) ×2 IMPLANT
GLOVE BIO SURGEON STRL SZ7 (GLOVE) ×2 IMPLANT
GLOVE BIO SURGEON STRL SZ7.5 (GLOVE) ×2 IMPLANT
GLOVE BIOGEL PI IND STRL 7.0 (GLOVE) ×1 IMPLANT
GLOVE BIOGEL PI IND STRL 8 (GLOVE) ×1 IMPLANT
GLOVE BIOGEL PI INDICATOR 7.0 (GLOVE) ×1
GLOVE BIOGEL PI INDICATOR 8 (GLOVE) ×1
GOWN STRL REUS W/TWL LRG LVL3 (GOWN DISPOSABLE) ×4 IMPLANT
GOWN STRL REUS W/TWL XL LVL3 (GOWN DISPOSABLE) ×2 IMPLANT
HANDPIECE INTERPULSE COAX TIP (DISPOSABLE) ×2
HEMOSTAT SURGICEL 2X14 (HEMOSTASIS) ×2 IMPLANT
HOOD PEEL AWAY FLYTE STAYCOOL (MISCELLANEOUS) ×6 IMPLANT
INSERT EPOLY STND HUMERUS 32MM (Shoulder) ×2 IMPLANT
INSERT EPOLYSTD HUMERUS 32MM (Shoulder) IMPLANT
KIT BASIN (CUSTOM PROCEDURE TRAY) ×2 IMPLANT
KIT TURNOVER KIT A (KITS) IMPLANT
MANIFOLD NEPTUNE II (INSTRUMENTS) ×2 IMPLANT
NDL TROCAR POINT SZ 2 1/2 (NEEDLE) ×1 IMPLANT
NEEDLE TROCAR POINT SZ 2 1/2 (NEEDLE) ×2 IMPLANT
NS IRRIG 1000ML POUR BTL (IV SOLUTION) ×2 IMPLANT
P2 COATDE GLNOID BSEPLT 6.5X30 (Shoulder) ×2 IMPLANT
PACK SHOULDER (CUSTOM PROCEDURE TRAY) ×2 IMPLANT
PAD COLD SHLDR WRAP-ON (PAD) ×1 IMPLANT
PROTECTOR NERVE ULNAR (MISCELLANEOUS) IMPLANT
RESTRAINT HEAD UNIVERSAL NS (MISCELLANEOUS) ×2 IMPLANT
RETRIEVER SUT HEWSON (MISCELLANEOUS) ×2 IMPLANT
SCREW BONE LOCKING RSP 5.0X14 (Screw) ×2 IMPLANT
SCREW BONE LOCKING RSP 5.0X30 (Screw) ×2 IMPLANT
SCREW BONE LOCKING RSP 5.0X34 (Screw) ×2 IMPLANT
SCREW BONE RSP LOCK 5X14 (Screw) IMPLANT
SCREW BONE RSP LOCK 5X26 (Screw) IMPLANT
SCREW BONE RSP LOCK 5X30 (Screw) IMPLANT
SCREW BONE RSP LOCK 5X34 (Screw) IMPLANT
SCREW BONE RSP LOCKING 5.0X26 (Screw) ×2 IMPLANT
SCREW RETAIN W/HEAD 32MM (Shoulder) ×1 IMPLANT
SET HNDPC FAN SPRY TIP SCT (DISPOSABLE) ×1 IMPLANT
SLING ARM FOAM STRAP LRG (SOFTGOODS) ×1 IMPLANT
SLING ARM IMMOBILIZER LRG (SOFTGOODS) ×1 IMPLANT
SMARTMIX MINI TOWER (MISCELLANEOUS) ×2
SPONGE LAP 18X18 RF (DISPOSABLE) ×2 IMPLANT
STEM HUMERAL STD SHELL 8X48 (Miscellaneous) ×1 IMPLANT
STRIP CLOSURE SKIN 1/2X4 (GAUZE/BANDAGES/DRESSINGS) ×2 IMPLANT
SUCTION FRAZIER HANDLE 12FR (TUBING) ×2
SUCTION TUBE FRAZIER 12FR DISP (TUBING) ×1 IMPLANT
SUPPORT WRAP ARM LG (MISCELLANEOUS) ×2 IMPLANT
SUT ETHIBOND 2 V 37 (SUTURE) ×2 IMPLANT
SUT MNCRL AB 4-0 PS2 18 (SUTURE) ×2 IMPLANT
SUT VIC AB 2-0 CT1 27 (SUTURE) ×2
SUT VIC AB 2-0 CT1 TAPERPNT 27 (SUTURE) ×1 IMPLANT
TAPE LABRALWHITE 1.5X36 (TAPE) ×2 IMPLANT
TAPE SUT LABRALTAP WHT/BLK (SUTURE) ×2 IMPLANT
TOWEL OR 17X26 10 PK STRL BLUE (TOWEL DISPOSABLE) ×2 IMPLANT
TOWER SMARTMIX MINI (MISCELLANEOUS) ×1 IMPLANT
WATER STERILE IRR 1000ML POUR (IV SOLUTION) ×2 IMPLANT
YANKAUER SUCT BULB TIP 10FT TU (MISCELLANEOUS) ×2 IMPLANT

## 2019-07-28 NOTE — Anesthesia Procedure Notes (Signed)
Procedure Name: Intubation Date/Time: 07/28/2019 10:13 AM Performed by: Lavina Hamman, CRNA Pre-anesthesia Checklist: Patient identified, Emergency Drugs available, Suction available, Patient being monitored and Timeout performed Patient Re-evaluated:Patient Re-evaluated prior to induction Oxygen Delivery Method: Circle system utilized Preoxygenation: Pre-oxygenation with 100% oxygen Induction Type: IV induction Ventilation: Mask ventilation without difficulty Laryngoscope Size: Mac and 3 Grade View: Grade II Tube type: Oral Tube size: 7.5 mm Number of attempts: 1 Airway Equipment and Method: Stylet Placement Confirmation: ETT inserted through vocal cords under direct vision,  positive ETCO2,  CO2 detector and breath sounds checked- equal and bilateral Secured at: 23 cm Tube secured with: Tape Dental Injury: Teeth and Oropharynx as per pre-operative assessment

## 2019-07-28 NOTE — Progress Notes (Signed)
Assisted Dr. Lillia Abed with right shoulder block. Side rails up, monitors on throughout procedure. See vital signs in flow sheet. Tolerated Procedure well.

## 2019-07-28 NOTE — Transfer of Care (Signed)
Immediate Anesthesia Transfer of Care Note  Patient: Randy Moreno  Procedure(s) Performed: Procedure(s): REVERSE TOTAL SHOULDER ARTHROPLASTY (Right)  Patient Location: PACU  Anesthesia Type:General  Level of Consciousness:  sedated, patient cooperative and responds to stimulation  Airway & Oxygen Therapy:Patient Spontanous Breathing and Patient connected to face mask oxgen  Post-op Assessment:  Report given to PACU RN and Post -op Vital signs reviewed and stable  Post vital signs:  Reviewed and stable  Last Vitals:  Vitals:   07/28/19 0914 07/28/19 1133  BP: 137/66 137/62  Pulse: 84 78  Resp: (!) 25 19  Temp:  36.4 C  SpO2: 0000000 [id number]    Complications: No apparent anesthesia complications

## 2019-07-28 NOTE — Discharge Instructions (Signed)
Discharge Instructions after Reverse Total Shoulder Arthroplasty   . A sling has been provided for you. You are to wear this at all times (except for bathing and dressing), until your first post operative visit with Dr. Tamera Punt. Please also wear while sleeping at night. While you bath and dress, let the arm/elbow extend straight down to stretch your elbow. Wiggle your fingers and pump your first while your in the sling to prevent hand swelling. . Use ice on the shoulder intermittently over the first 48 hours after surgery. Continue to use ice or and ice machine as needed after 48 hours for pain control/swelling.  . Pain medicine has been prescribed for you.  . Use your medicine liberally over the first 48 hours, and then you can begin to taper your use. You may take Extra Strength Tylenol or Tylenol only in place of the pain pills. DO NOT take ANY nonsteroidal anti-inflammatory pain medications: Advil, Motrin, Ibuprofen, Aleve, Naproxen or Naprosyn.  . Take one aspirin a day for 2 weeks after surgery, unless you have an aspirin sensitivity/allergy or asthma.  . Leave your dressing on until your first follow up visit.  You may shower with the dressing.  Hold your arm as if you still have your sling on while you shower. . Simply allow the water to wash over the site and then pat dry. Make sure your axilla (armpit) is completely dry after showering.    Please call [phone] during normal business hours or [phone] after hours for any problems. Including the following:  - excessive redness of the incisions - drainage for more than 4 days - fever of more than 101.5 F  *Please note that pain medications will not be refilled after hours or on weekends.     General Anesthesia, Adult, Care After This sheet gives you information about how to care for yourself after your procedure. Your health care provider may also give you more specific instructions. If you have problems or questions, contact  your health care provider. What can I expect after the procedure? After the procedure, the following side effects are common:  Pain or discomfort at the IV site.  Nausea.  Vomiting.  Sore throat.  Trouble concentrating.  Feeling cold or chills.  Weak or tired.  Sleepiness and fatigue.  Soreness and body aches. These side effects can affect parts of the body that were not involved in surgery. Follow these instructions at home:  For at least 24 hours after the procedure:  Have a responsible adult stay with you. It is important to have someone help care for you until you are awake and alert.  Rest as needed.  Do not: ? Participate in activities in which you could fall or become injured. ? Drive. ? Use heavy machinery. ? Drink alcohol. ? Take sleeping pills or medicines that cause drowsiness. ? Make important decisions or sign legal documents. ? Take care of children on your own. Eating and drinking  Follow any instructions from your health care provider about eating or drinking restrictions.  When you feel hungry, start by eating small amounts of foods that are soft and easy to digest (bland), such as toast. Gradually return to your regular diet.  Drink enough fluid to keep your urine pale yellow.  If you vomit, rehydrate by drinking water, juice, or clear broth. General instructions  If you have sleep apnea, surgery and certain medicines can increase your risk for breathing problems. Follow instructions from your health care provider about wearing  your sleep device: ? Anytime you are sleeping, including during daytime naps. ? While taking prescription pain medicines, sleeping medicines, or medicines that make you drowsy.  Return to your normal activities as told by your health care provider. Ask your health care provider what activities are safe for you.  Take over-the-counter and prescription medicines only as told by your health care provider.  If you smoke, do  not smoke without supervision.  Keep all follow-up visits as told by your health care provider. This is important. Contact a health care provider if:  You have nausea or vomiting that does not get better with medicine.  You cannot eat or drink without vomiting.  You have pain that does not get better with medicine.  You are unable to pass urine.  You develop a skin rash.  You have a fever.  You have redness around your IV site that gets worse. Get help right away if:  You have difficulty breathing.  You have chest pain.  You have blood in your urine or stool, or you vomit blood. Summary  After the procedure, it is common to have a sore throat or nausea. It is also common to feel tired.  Have a responsible adult stay with you for the first 24 hours after general anesthesia. It is important to have someone help care for you until you are awake and alert.  When you feel hungry, start by eating small amounts of foods that are soft and easy to digest (bland), such as toast. Gradually return to your regular diet.  Drink enough fluid to keep your urine pale yellow.  Return to your normal activities as told by your health care provider. Ask your health care provider what activities are safe for you. This information is not intended to replace advice given to you by your health care provider. Make sure you discuss any questions you have with your health care provider. Document Revised: 03/06/2017 Document Reviewed: 10/17/2016 Elsevier Patient Education  [place].

## 2019-07-28 NOTE — Anesthesia Procedure Notes (Signed)
Anesthesia Regional Block: Interscalene brachial plexus block   Pre-Anesthetic Checklist: ,, timeout performed, Correct Patient, Correct Site, Correct Laterality, Correct Procedure, Correct Position, site marked, Risks and benefits discussed,  Surgical consent,  Pre-op evaluation,  At surgeon's request and post-op pain management  Laterality: Right  Prep: chloraprep       Needles:  Injection technique: Single-shot  Needle Type: Echogenic Stimulator Needle     Needle Length: 5cm  Needle Gauge: 21     Additional Needles:   Procedures:, nerve stimulator,,,,,,,   Nerve Stimulator or Paresthesia:  Response: 0.4 mA,   Additional Responses:   Narrative:  Start time: 07/28/2019 8:50 AM End time: 07/28/2019 9:00 AM Injection made incrementally with aspirations every 5 mL.  Performed by: Personally  Anesthesiologist: Lillia Abed, MD  Additional Notes: Monitors applied. Patient sedated. Sterile prep and drape,hand hygiene and sterile gloves were used. Relevant anatomy identified.Needle position confirmed.Local anesthetic injected incrementally after negative aspiration. Local anesthetic spread visualized around nerve(s). Vascular puncture avoided. No complications. Image printed for medical record.The patient tolerated the procedure well.

## 2019-07-28 NOTE — H&P (Signed)
Randy Moreno is an 74 y.o. male.   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  Pain interferes with sleep and quality of life.   Past Medical History:  Diagnosis Date  . Allergy   . Arthritis    neck  . Cataract   . Gout   . Hyperlipidemia   . Hypertension     Past Surgical History:  Procedure Laterality Date  . APPENDECTOMY  1986  . CATARACT EXTRACTION    . COLONOSCOPY    . POLYPECTOMY    . ROTATOR CUFF REPAIR Right    march 4     Family History  Problem Relation Age of Onset  . Colon cancer Mother 62  . Heart disease Father   . Diabetes Father   . Heart Problems Paternal Grandfather   . Esophageal cancer Neg Hx   . Rectal cancer Neg Hx   . Stomach cancer Neg Hx    Social History:  reports that he quit smoking about 32 years ago. He has never used smokeless tobacco. He reports current alcohol use of about 28.0 standard drinks of alcohol per week. He reports that he does not use drugs.  Allergies: No Known Allergies  Medications Prior to Admission  Medication Sig Dispense Refill  . amLODipine (NORVASC) 5 MG tablet Take 5 mg by mouth daily.    . ANDROGEL PUMP 20.25 MG/ACT (1.62%) GEL Apply 1 application topically daily.     Marland Kitchen atorvastatin (LIPITOR) 20 MG tablet Take 20 mg by mouth daily.    . cholecalciferol (VITAMIN D) 1000 UNITS tablet Take 1,000 Units by mouth daily.    . Cyanocobalamin (VITAMIN B 12 PO) Take 1 tablet by mouth daily.     Marland Kitchen escitalopram (LEXAPRO) 10 MG tablet Take 10 mg by mouth daily.    . fish oil-omega-3 fatty acids 1000 MG capsule Take 2 g by mouth daily.    Marland Kitchen losartan-hydrochlorothiazide (HYZAAR) 100-12.5 MG per tablet Take 1 tablet by mouth daily.    . Multiple Vitamin (MULTIVITAMIN) tablet Take 1 tablet by mouth daily.    . saw palmetto 160 MG capsule Take 160 mg by mouth daily.    . sildenafil (REVATIO) 20 MG tablet TK 4 OR 5 TS PO  30  MINS TO 4 HOURS PRIOR TO SEXUAL ACTIVITY  5  . colchicine 0.6 MG tablet Take 0.6 mg by mouth 2 (two) times daily as needed (gout).       No results found for this or any previous visit (from the past 48 hour(s)). No results found.  Review of Systems  All other systems reviewed and are negative.   Blood pressure (!) 145/79, pulse 85, temperature 97.9 F (36.6 C), temperature source Oral, resp. rate 16, height 5\' 7"  (1.702 m), weight 90.3 kg, SpO2 98 %. Physical Exam  Constitutional: He is oriented to person, place, and time. He appears well-developed and well-nourished.  HENT:  Head: Atraumatic.  Eyes: EOM are normal.  Cardiovascular: Intact distal pulses.  Respiratory: Effort normal.  Musculoskeletal:     Comments: R shoulder pain with limited ROM. NVID.  Neurological: He is alert and oriented to person, place, and time.  Skin: Skin is warm and dry.  Psychiatric: He has a normal mood and affect.     Assessment/Plan R shoulder rotator cuff tear arthopathy. Plan R shoulder reverse TSA Risks / benefits of surgery discussed Consent on chart  NPO for  OR Preop antibiotics   Isabella Stalling, MD 07/28/2019, 8:58 AM

## 2019-07-28 NOTE — Anesthesia Preprocedure Evaluation (Signed)
Anesthesia Evaluation  Patient identified by MRN, date of birth, ID band Patient awake    Reviewed: Allergy & Precautions, NPO status , Patient's Chart, lab work & pertinent test results  Airway Mallampati: I  TM Distance: >3 FB Neck ROM: Full    Dental   Pulmonary former smoker,    Pulmonary exam normal        Cardiovascular hypertension, Pt. on medications Normal cardiovascular exam     Neuro/Psych    GI/Hepatic   Endo/Other    Renal/GU      Musculoskeletal   Abdominal   Peds  Hematology   Anesthesia Other Findings   Reproductive/Obstetrics                             Anesthesia Physical Anesthesia Plan  ASA: II  Anesthesia Plan: General   Post-op Pain Management:  Regional for Post-op pain   Induction: Intravenous  PONV Risk Score and Plan: 2  Airway Management Planned: Oral ETT  Additional Equipment:   Intra-op Plan:   Post-operative Plan: Extubation in OR  Informed Consent: I have reviewed the patients History and Physical, chart, labs and discussed the procedure including the risks, benefits and alternatives for the proposed anesthesia with the patient or authorized representative who has indicated his/her understanding and acceptance.       Plan Discussed with: CRNA and Surgeon  Anesthesia Plan Comments:         Anesthesia Quick Evaluation

## 2019-07-28 NOTE — Op Note (Signed)
Procedure(s): REVERSE TOTAL SHOULDER ARTHROPLASTY Procedure Note  Randy Moreno male 74 y.o. 07/28/2019   Preoperative diagnosis: Right shoulder rotator cuff tear arthropathy  Postoperative diagnosis: Same  Procedure(s) and Anesthesia Type:   RIGHT REVERSE TOTAL SHOULDER ARTHROPLASTY - Choice   Indications:  74 y.o. 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 Pain and dysfunction interfered with quality of life and nonoperative treatment with activity modification, NSAIDS and injections failed.     Surgeon: Isabella Stalling   Assistants: Jeanmarie Hubert PA-C [facility] was present and scrubbed throughout the procedure and was essential in positioning, retraction, exposure, and closure)  Anesthesia: General endotracheal anesthesia with preoperative interscalene block given by the attending anesthesiologist    Procedure Detail  REVERSE TOTAL SHOULDER ARTHROPLASTY   Estimated Blood Loss:  200 mL         Drains: none  Blood Given: none          Specimens: none        Complications:  * No complications entered in OR log *         Disposition: PACU - hemodynamically stable.         Condition: stable      OPERATIVE FINDINGS:  A DJO Altivate pressfit reverse total shoulder arthroplasty was placed with a  size 8 stem, a 32 standard glenosphere, and a standard poly insert. The base plate  fixation was excellent.  PROCEDURE: The patient was identified in the preoperative holding area  where I personally marked the operative site after verifying site, side,  and procedure with the patient. An interscalene block given by  the attending anesthesiologist in the holding area and the patient was taken back to the operating room where all extremities were  carefully padded in position after general anesthesia was induced. She  was placed in a beach-chair position and the operative upper extremity was  prepped and draped in a standard  sterile fashion. An approximately 10-  cm incision was made from the tip of the coracoid process to the center  point of the humerus at the level of the axilla. Dissection was carried  down through subcutaneous tissues to the level of the cephalic vein  which was taken laterally with the deltoid. The pectoralis major was  retracted medially. The subdeltoid space was developed and the lateral  edge of the conjoined tendon was identified. The undersurface of  conjoined tendon was palpated and the musculocutaneous nerve was not in  the field. Retractor was placed underneath the conjoined and second  retractor was placed lateral into the deltoid. The circumflex humeral  artery and vessels were identified and clamped and coagulated. The  biceps tendon was absent.  The subscapularis was taken down as a peel with the underlying capsule.  The  joint was then gently externally rotated while the capsule was released  from the humeral neck around to just beyond the 6 o'clock position. At  this point, the joint was dislocated and the humeral head was presented  into the wound. The excessive osteophyte formation was removed with a  large rongeur.  The cutting guide was used to make the appropriate  head cut and the head was saved for potentially bone grafting.  The glenoid was exposed with the arm in an  abducted extended position. The anterior and posterior labrum were  completely excised and the capsule was released circumferentially to  allow for exposure of the glenoid for preparation. The 2.5 mm drill was  placed using the guide in 5-10 inferior angulation and the tap was then advanced in the same hole. Small and large reamers were then used. The tap was then removed and the Metaglene was then screwed in with excellent purchase.  The peripheral guide was then used to drilled measured and filled peripheral locking screws. The size 32 standard glenosphere was then impacted on the [facility] taper and the  central screw was placed. The humerus was then again exposed and the diaphyseal reamers were used followed by the metaphyseal reamers. The final broach was left in place in the proximal trial was placed. The joint was reduced and with this implant it was felt that soft tissue tensioning was appropriate with excellent stability and excellent range of motion. Therefore, final humeral stem was placed press-fit.  And then the trial polyethylene inserts were tested again and the above implant was felt to be the most appropriate for final insertion. The joint was reduced taken through full range of motion and felt to be stable. Soft tissue tension was appropriate.  The joint was then copiously irrigated with pulse  lavage and the wound was then closed. The subscapularis was repaired through bone tunnels with labral tapes around the implant.  Skin was closed with 2-0 Vicryl in a deep dermal layer and 4-0  Monocryl for skin closure. Steri-Strips were applied. Sterile  dressings were then applied as well as a sling. The patient was allowed  to awaken from general anesthesia, transferred to stretcher, and taken  to recovery room in stable condition.   POSTOPERATIVE PLAN: The patient will be observed in the recovery room.  If he has good pain control with the regional block and is doing well he could be discharged home with his family today.  If there is any concern we can observe him overnight.

## 2019-07-28 NOTE — Anesthesia Postprocedure Evaluation (Signed)
Anesthesia Post Note  Patient: Randy Moreno  Procedure(s) Performed: REVERSE TOTAL SHOULDER ARTHROPLASTY (Right Shoulder)     Patient location during evaluation: PACU Anesthesia Type: General Level of consciousness: awake and alert Pain management: pain level controlled Vital Signs Assessment: post-procedure vital signs reviewed and stable Respiratory status: spontaneous breathing, nonlabored ventilation, respiratory function stable and patient connected to nasal cannula oxygen Cardiovascular status: blood pressure returned to baseline and stable Postop Assessment: no apparent nausea or vomiting Anesthetic complications: no    Last Vitals:  Vitals:   07/28/19 1200 07/28/19 1215  BP: 127/60 122/66  Pulse: 76 69  Resp: 15 10  Temp:    SpO2: 93% 94%    Last Pain:  Vitals:   07/28/19 1210  TempSrc:   PainSc: 4                  [place] DAVID

## 2019-08-01 ENCOUNTER — Encounter: Payer: Self-pay | Admitting: *Deleted

## 2019-08-18 LAB — AEROBIC/ANAEROBIC CULTURE W GRAM STAIN (SURGICAL/DEEP WOUND)
Culture: NO GROWTH
Culture: NO GROWTH

## 2019-10-19 ENCOUNTER — Other Ambulatory Visit: Payer: Self-pay | Admitting: Orthopedic Surgery

## 2019-10-19 DIAGNOSIS — M25511 Pain in right shoulder: Secondary | ICD-10-CM

## 2019-10-19 DIAGNOSIS — Z96619 Presence of unspecified artificial shoulder joint: Secondary | ICD-10-CM

## 2019-10-21 ENCOUNTER — Other Ambulatory Visit: Payer: Self-pay | Admitting: Internal Medicine

## 2019-10-21 DIAGNOSIS — S42024S Nondisplaced fracture of shaft of right clavicle, sequela: Secondary | ICD-10-CM

## 2019-10-25 ENCOUNTER — Other Ambulatory Visit: Payer: Self-pay | Admitting: Internal Medicine

## 2019-10-25 DIAGNOSIS — E785 Hyperlipidemia, unspecified: Secondary | ICD-10-CM

## 2019-10-26 ENCOUNTER — Ambulatory Visit
Admission: RE | Admit: 2019-10-26 | Discharge: 2019-10-26 | Disposition: A | Discharge status: Home / Self Care | Payer: Medicare Other | Source: Ambulatory Visit | Attending: Orthopedic Surgery | Admitting: Orthopedic Surgery

## 2019-10-26 DIAGNOSIS — Z96619 Presence of unspecified artificial shoulder joint: Secondary | ICD-10-CM

## 2019-10-26 DIAGNOSIS — M25511 Pain in right shoulder: Secondary | ICD-10-CM

## 2019-11-01 ENCOUNTER — Other Ambulatory Visit: Payer: Self-pay | Admitting: Internal Medicine

## 2019-11-01 ENCOUNTER — Other Ambulatory Visit: Payer: Medicare Other

## 2019-11-01 DIAGNOSIS — S42024S Nondisplaced fracture of shaft of right clavicle, sequela: Secondary | ICD-10-CM

## 2019-11-02 ENCOUNTER — Other Ambulatory Visit: Payer: Self-pay

## 2019-11-02 ENCOUNTER — Other Ambulatory Visit: Payer: Medicare Other

## 2019-11-02 ENCOUNTER — Ambulatory Visit
Admission: RE | Admit: 2019-11-02 | Discharge: 2019-11-02 | Disposition: A | Discharge status: Home / Self Care | Payer: Medicare Other | Source: Ambulatory Visit | Attending: Internal Medicine | Admitting: Internal Medicine

## 2019-11-02 DIAGNOSIS — S42024S Nondisplaced fracture of shaft of right clavicle, sequela: Secondary | ICD-10-CM

## 2019-11-04 ENCOUNTER — Ambulatory Visit
Admission: RE | Admit: 2019-11-04 | Discharge: 2019-11-04 | Disposition: A | Discharge status: Home / Self Care | Payer: Medicare Other | Source: Ambulatory Visit | Attending: Internal Medicine | Admitting: Internal Medicine

## 2019-11-04 DIAGNOSIS — E785 Hyperlipidemia, unspecified: Secondary | ICD-10-CM

## 2019-12-01 NOTE — Progress Notes (Signed)
Cardiology Office Note:   Date:  12/02/2019  NAME:  Randy Moreno    MRN: [id number] DOB:  [birth year]   PCP:  Crist Infante, MD  Cardiologist:  Evalina Field, MD   Referring MD: Crist Infante, MD   Chief Complaint  Patient presents with  . Coronary Artery Disease   History of Present Illness:   Randy Moreno is a 74 y.o. male with a hx of CAD, HTN, HLD who is being seen today for the evaluation of CAD at the request of Crist Infante, MD. Recent CAC score 2980 (99th percentile). Sent for evaluation. He presents for evaluation of elevated coronary calcium score.  He reports he is not that active.  He is not exercising currently.  When he does exercise he does get right shoulder pain.  Apparently had shoulder surgery some years ago.  He does get occasionally short of breath when he exerts himself but no chest pain or pressure.  He has no pain in the left arm.  He reports he tries to work on his diet.  He is taking Lipitor 40 mg daily.  Most recent LDL cholesterol 64.  He is never had a heart attack or stroke.  He is a former smoker of 20 years.  He used to take aspirin but stopped taking it.  He also reports a strong family history of heart disease in his father.  His EKG today demonstrates normal sinus rhythm with an incomplete right bundle branch block.  He did complete recent [facility] screening which showed minimal carotid artery disease.  He has no evidence of a AAA.  His blood pressure is well controlled 132/72.  He does take losartan and HCTZ.  He is on Lipitor 20 mg daily.  Also takes Norvasc.  Problem List 1. CAD -CAC score 2980 (99th percentile) 2. HLD -Total cholesterol 150, HDL 63, LDL 64, triglycerides 113 3. HTN 4. Former tobacco abuse -AAA screening negative  Past Medical History: Past Medical History:  Diagnosis Date  . Allergy   . Arthritis    neck  . Cataract   . Gout   . Hyperlipidemia   . Hypertension     Past Surgical History: Past Surgical History:  Procedure  Laterality Date  . APPENDECTOMY  1986  . CATARACT EXTRACTION    . COLONOSCOPY    . POLYPECTOMY    . ROTATOR CUFF REPAIR Right    march 4   . TOTAL SHOULDER ARTHROPLASTY Right 07/28/2019   Procedure: REVERSE TOTAL SHOULDER ARTHROPLASTY;  Surgeon: Tania Ade, MD;  Location: [facility];  Service: Orthopedics;  Laterality: Right;    Current Medications: Current Meds  Medication Sig  . amLODipine (NORVASC) 5 MG tablet Take 5 mg by mouth daily.  . ANDROGEL PUMP 20.25 MG/ACT (1.62%) GEL Apply 1 application topically daily.   Marland Kitchen atorvastatin (LIPITOR) 20 MG tablet Take 20 mg by mouth daily.  . cholecalciferol (VITAMIN D) 1000 UNITS tablet Take 1,000 Units by mouth daily.  . colchicine 0.6 MG tablet Take 0.6 mg by mouth 2 (two) times daily as needed (gout).   . Cyanocobalamin (VITAMIN B 12 PO) Take 1 tablet by mouth daily.   Marland Kitchen escitalopram (LEXAPRO) 10 MG tablet Take 10 mg by mouth daily.  . fish oil-omega-3 fatty acids 1000 MG capsule Take 2 g by mouth daily.  Marland Kitchen losartan-hydrochlorothiazide (HYZAAR) 100-12.5 MG per tablet Take 1 tablet by mouth daily.  . Multiple Vitamin (MULTIVITAMIN) tablet Take 1 tablet by mouth daily.  Marland Kitchen  saw palmetto 160 MG capsule Take 160 mg by mouth daily.  . sildenafil (REVATIO) 20 MG tablet TK 4 OR 5 TS PO 30  MINS TO 4 HOURS PRIOR TO SEXUAL ACTIVITY     Allergies:    Patient has no known allergies.   Social History: Social History   Socioeconomic History  . Marital status: Married    Spouse name: Randy Moreno   . Number of children: 2  . Years of education: BS  . Highest education level: Not on file  Occupational History  . Occupation: Retired  Tobacco Use  . Smoking status: Former Smoker    Years: 20.00    Quit date: 07/22/1987    Years since quitting: 32.3  . Smokeless tobacco: Never Used  Vaping Use  . Vaping Use: Never used  Substance and Sexual Activity  . Alcohol use: Yes    Alcohol/week: 28.0 standard drinks    Types: 28 Glasses of wine per week      Comment: daily  . Drug use: No  . Sexual activity: Not on file  Other Topics Concern  . Not on file  Social History Narrative   Patient lives with wife De Queen.    patient has 2 children.    Patient has his BS   Patient drinks 2-4 glasses wife dialy.    Drinks caffeine daily.          Social Determinants of Health   Financial Resource Strain:   . Difficulty of Paying Living Expenses: Not on file  Food Insecurity:   . Worried About Charity fundraiser in the Last Year: Not on file  . Ran Out of Food in the Last Year: Not on file  Transportation Needs:   . Lack of Transportation (Medical): Not on file  . Lack of Transportation (Non-Medical): Not on file  Physical Activity:   . Days of Exercise per Week: Not on file  . Minutes of Exercise per Session: Not on file  Stress:   . Feeling of Stress : Not on file  Social Connections:   . Frequency of Communication with Friends and Family: Not on file  . Frequency of Social Gatherings with Friends and Family: Not on file  . Attends Religious Services: Not on file  . Active Member of Clubs or Organizations: Not on file  . Attends Archivist Meetings: Not on file  . Marital Status: Not on file     Family History: The patient's family history includes Colon cancer (age of onset: 31) in his mother; Diabetes in his father; Heart Problems in his paternal grandfather; Heart disease in his father and paternal uncle. There is no history of Esophageal cancer, Rectal cancer, or Stomach cancer.  ROS:   All other ROS reviewed and negative. Pertinent positives noted in the HPI.     EKGs/Labs/Other Studies Reviewed:   The following studies were personally reviewed by me today:  EKG:  EKG is ordered today.  The ekg ordered today demonstrates normal sinus rhythm, incomplete right bundle branch block, no acute hemic changes, and was personally reviewed by me.   CT Calcium Score 11/04/2019 1. Total Agatston score of 2,980,  corresponding to 99th percentile for age, sex, and race based cohort. 2. Marked right hemidiaphragm elevation. 3. Aortic Atherosclerosis (ICD10-I70.0).  Recent Labs: 07/21/2019: ALT 25; BUN 18; Creatinine, Ser 1.11; Hemoglobin 15.4; Platelets 167; Potassium 3.6; Sodium 140   Recent Lipid Panel No results found for: CHOL, TRIG, HDL, CHOLHDL, VLDL, LDLCALC, LDLDIRECT  Physical Exam:   VS:  BP 132/72   Pulse 80   Ht 5\' 7"  (1.702 m)   Wt 194 lb (88 kg)   SpO2 99%   BMI 30.38 kg/m    Wt Readings from Last 3 Encounters:  12/02/19 194 lb (88 kg)  07/28/19 199 lb (90.3 kg)  07/21/19 199 lb (90.3 kg)    General: Well nourished, well developed, in no acute distress Heart: Atraumatic, normal size  Eyes: PEERLA, EOMI  Neck: Supple, no JVD Endocrine: No thryomegaly Cardiac: Normal S1, S2; RRR; no murmurs, rubs, or gallops Lungs: Clear to auscultation bilaterally, no wheezing, rhonchi or rales  Abd: Soft, nontender, no hepatomegaly  Ext: No edema, pulses 2+ Musculoskeletal: No deformities, BUE and BLE strength normal and equal Skin: Warm and dry, no rashes   Neuro: Alert and oriented to person, place, time, and situation, CNII-XII grossly intact, no focal deficits  Psych: Normal mood and affect   ASSESSMENT:   SAVAUGHN KARWOWSKI is a 74 y.o. male who presents for the following: 1. Coronary artery disease involving native coronary artery of native heart without angina pectoris   2. SOB (shortness of breath)   3. Essential hypertension   4. Mixed hyperlipidemia     PLAN:   1. Coronary artery disease involving native coronary artery of native heart without angina pectoris 2. SOB (shortness of breath) -Coronary calcium score around 2900.  99th percentile.  No symptoms of chest pain but can get short of breath.  I recommended an exercise nuclear medicine stress test.  He also needs an echocardiogram.  I recommended aspirin 81 mg daily.  Most recent LDL cholesterol was 64.  He takes Lipitor 40  mg daily.  His cholesterol is at goal.  We will make sure he has no high risk features on stress test.  Main stay of treatment will be risk factor modification.  His EKG today is without acute ischemic changes or evidence of prior infarction.  I will plan to see him yearly.  We will notify him if he needs to see me sooner.  3. Essential hypertension -Blood pressure well controlled.  Continue Norvasc, losartan, HCTZ.  4. Mixed hyperlipidemia -Most recent LDL cholesterol 64.  Continue Lipitor 40 mg daily.   Disposition: Return in about 1 year (around 12/01/2020).  Medication Adjustments/Labs and Tests Ordered: Current medicines are reviewed at length with the patient today.  Concerns regarding medicines are outlined above.  No orders of the defined types were placed in this encounter.  No orders of the defined types were placed in this encounter.   Patient Instructions  Medication Instructions:  The current medical regimen is effective;  continue present plan and medications.  *If you need a refill on your cardiac medications before your next appointment, please call your pharmacy*   Lab Work: COVID TEST NEEDED  If you have labs (blood work) drawn today and your tests are completely normal, you will receive your results only by: Marland Kitchen MyChart Message (if you have MyChart) OR . A paper copy in the mail If you have any lab test that is abnormal or we need to change your treatment, we will call you to review the results.   Testing/Procedures: Your physician has requested that you have an Exercise Myoview. A cardiac stress test is a cardiological test that measures the heart's ability to respond to external stress in a controlled clinical environment. The stress response is induced by exercise (exercise-treadmill). For further information please visit [email or web address]. If  you have questions or concerns about your appointment, you can call the Nuclear Lab at [phone].   Echocardiogram  - Your physician has requested that you have an echocardiogram. Echocardiography is a painless test that uses sound waves to create images of your heart. It provides your doctor with information about the size and shape of your heart and how well your heart's chambers and valves are working. This procedure takes approximately one hour. There are no restrictions for this procedure. This will be performed at our [facility] location - [address], [address].    Follow-Up: At [facility], you and your health needs are our priority.  As part of our continuing mission to provide you with exceptional heart care, we have created designated Provider Care Teams.  These Care Teams include your primary Cardiologist (physician) and Advanced Practice Providers (APPs -  Physician Assistants and Nurse Practitioners) who all work together to provide you with the care you need, when you need it.  We recommend signing up for the patient portal called "MyChart".  Sign up information is provided on this After Visit Summary.  MyChart is used to connect with patients for Virtual Visits (Telemedicine).  Patients are able to view lab/test results, encounter notes, upcoming appointments, etc.  Non-urgent messages can be sent to your provider as well.   To learn more about what you can do with MyChart, go to [email or web address].    Your next appointment:   12 month(s)  The format for your next appointment:   In Person  Provider:   Eleonore Chiquito, MD       Signed, Addison Naegeli. Audie Box, [place]  [address] [place], [place] [address] [phone]  12/02/2019 10:09 AM

## 2019-12-02 ENCOUNTER — Ambulatory Visit: Payer: Medicare Other | Admitting: Cardiovascular Disease

## 2019-12-02 ENCOUNTER — Encounter: Payer: Self-pay | Admitting: Cardiovascular Disease

## 2019-12-02 ENCOUNTER — Ambulatory Visit: Payer: Medicare Other | Admitting: Internal Medicine

## 2019-12-02 ENCOUNTER — Other Ambulatory Visit: Payer: Self-pay

## 2019-12-02 VITALS — BP 132/72 | HR 80 | Ht 67.0 in | Wt 194.0 lb

## 2019-12-02 DIAGNOSIS — I10 Essential (primary) hypertension: Secondary | ICD-10-CM

## 2019-12-02 DIAGNOSIS — I2510 Atherosclerotic heart disease of native coronary artery without angina pectoris: Secondary | ICD-10-CM

## 2019-12-02 DIAGNOSIS — E782 Mixed hyperlipidemia: Secondary | ICD-10-CM | POA: Diagnosis not present

## 2019-12-02 DIAGNOSIS — R0602 Shortness of breath: Secondary | ICD-10-CM

## 2019-12-02 NOTE — Patient Instructions (Signed)
Medication Instructions:  The current medical regimen is effective;  continue present plan and medications.  *If you need a refill on your cardiac medications before your next appointment, please call your pharmacy*   Lab Work: COVID TEST NEEDED  If you have labs (blood work) drawn today and your tests are completely normal, you will receive your results only by: Marland Kitchen MyChart Message (if you have MyChart) OR . A paper copy in the mail If you have any lab test that is abnormal or we need to change your treatment, we will call you to review the results.   Testing/Procedures: Your physician has requested that you have an Exercise Myoview. A cardiac stress test is a cardiological test that measures the heart's ability to respond to external stress in a controlled clinical environment. The stress response is induced by exercise (exercise-treadmill). For further information please visit [email or web address].[email or web address]. If you have questions or concerns about your appointment, you can call the Nuclear Lab at [phone].   Echocardiogram - Your physician has requested that you have an echocardiogram. Echocardiography is a painless test that uses sound waves to create images of your heart. It provides your doctor with information about the size and shape of your heart and how well your heart's chambers and valves are working. This procedure takes approximately one hour. There are no restrictions for this procedure. This will be performed at our [facility] location - [address], [address].    Follow-Up: At [facility], you and your health needs are our priority.  As part of our continuing mission to provide you with exceptional heart care, we have created designated Provider Care Teams.  These Care Teams include your primary Cardiologist (physician) and Advanced Practice Providers (APPs -  Physician Assistants and Nurse Practitioners) who all work together to provide you with the care you need, when you need  it.  We recommend signing up for the patient portal called "MyChart".  Sign up information is provided on this After Visit Summary.  MyChart is used to connect with patients for Virtual Visits (Telemedicine).  Patients are able to view lab/test results, encounter notes, upcoming appointments, etc.  Non-urgent messages can be sent to your provider as well.   To learn more about what you can do with MyChart, go to [email or web address].    Your next appointment:   12 month(s)  The format for your next appointment:   In Person  Provider:   Eleonore Chiquito, MD

## 2019-12-06 ENCOUNTER — Other Ambulatory Visit (HOSPITAL_COMMUNITY)
Admission: RE | Admit: 2019-12-06 | Discharge: 2019-12-06 | Disposition: A | Discharge status: Home / Self Care | Payer: Medicare Other | Source: Ambulatory Visit | Attending: Cardiovascular Disease | Admitting: Cardiovascular Disease

## 2019-12-06 DIAGNOSIS — Z01812 Encounter for preprocedural laboratory examination: Secondary | ICD-10-CM | POA: Diagnosis present

## 2019-12-06 DIAGNOSIS — Z20822 Contact with and (suspected) exposure to covid-19: Secondary | ICD-10-CM | POA: Diagnosis not present

## 2019-12-06 LAB — SARS CORONAVIRUS 2 (TAT 6-24 HRS): SARS Coronavirus 2: NEGATIVE

## 2019-12-07 ENCOUNTER — Telehealth (HOSPITAL_COMMUNITY): Payer: Self-pay

## 2019-12-07 NOTE — Telephone Encounter (Signed)
Encounter complete. 

## 2019-12-09 ENCOUNTER — Ambulatory Visit (HOSPITAL_COMMUNITY)
Admission: RE | Admit: 2019-12-09 | Discharge: 2019-12-09 | Disposition: A | Discharge status: Home / Self Care | Payer: Medicare Other | Source: Ambulatory Visit | Attending: Cardiovascular Disease | Admitting: Cardiovascular Disease

## 2019-12-09 ENCOUNTER — Other Ambulatory Visit: Payer: Self-pay

## 2019-12-09 DIAGNOSIS — R0602 Shortness of breath: Secondary | ICD-10-CM | POA: Insufficient documentation

## 2019-12-09 LAB — MYOCARDIAL PERFUSION IMAGING
Estimated workload: 7 METS
Exercise duration (min): 5 min
Exercise duration (sec): 31 s
MPHR: 148 {beats}/min
Peak HR: 136 {beats}/min
Percent HR: 91 %
Rest HR: 74 {beats}/min
SDS: 2
SRS: 2
SSS: 4
TID: 1.04

## 2019-12-09 MED ORDER — TECHNETIUM TC 99M TETROFOSMIN IV KIT
10.1000 | PACK | Freq: Once | INTRAVENOUS | Status: AC | PRN
  Administered 2019-12-09: 10.1 via INTRAVENOUS
  Filled 2019-12-09: qty 11

## 2019-12-09 MED ORDER — TECHNETIUM TC 99M TETROFOSMIN IV KIT
30.6000 | PACK | Freq: Once | INTRAVENOUS | Status: AC | PRN
  Administered 2019-12-09: 30.6 via INTRAVENOUS
  Filled 2019-12-09: qty 31

## 2019-12-15 ENCOUNTER — Other Ambulatory Visit: Payer: Self-pay

## 2019-12-15 ENCOUNTER — Ambulatory Visit (HOSPITAL_COMMUNITY): Payer: Medicare Other | Attending: Cardiology

## 2019-12-15 DIAGNOSIS — R0602 Shortness of breath: Secondary | ICD-10-CM | POA: Diagnosis present

## 2019-12-15 LAB — ECHOCARDIOGRAM COMPLETE
Area-P 1/2: 3.99 cm2
S' Lateral: 2.9 cm

## 2019-12-16 NOTE — Progress Notes (Signed)
Mychart 9/30

## 2019-12-27 ENCOUNTER — Ambulatory Visit: Payer: Medicare Other | Admitting: Cardiovascular Disease

## 2020-02-08 ENCOUNTER — Other Ambulatory Visit (HOSPITAL_COMMUNITY): Payer: Self-pay | Admitting: Internal Medicine

## 2020-02-08 ENCOUNTER — Ambulatory Visit: Payer: Medicare Other | Attending: Internal Medicine

## 2020-02-08 DIAGNOSIS — Z23 Encounter for immunization: Secondary | ICD-10-CM

## 2020-02-08 NOTE — Progress Notes (Signed)
   Covid-19 Vaccination Clinic  Name:  MARKO SKALSKI    MRN: [id number] DOB: [birth year]  02/08/2020  Mr. Dubey was observed post Covid-19 immunization for 15 minutes without incident. He was provided with Vaccine Information Sheet and instruction to access the V-Safe system.   Mr. Casalino was instructed to call 911 with any severe reactions post vaccine: Marland Kitchen Difficulty breathing  . Swelling of face and throat  . A fast heartbeat  . A bad rash all over body  . Dizziness and weakness   Immunizations Administered    Name Date Dose VIS Date Route   Pfizer COVID-19 Vaccine 02/08/2020  9:44 AM 0.3 mL 01/04/2020 Intramuscular   Manufacturer: [place]   Lot: [id number]   NDC: [address]-9

## 2020-06-22 ENCOUNTER — Other Ambulatory Visit (HOSPITAL_COMMUNITY): Payer: Self-pay

## 2020-08-29 ENCOUNTER — Other Ambulatory Visit: Payer: Self-pay

## 2020-08-29 ENCOUNTER — Ambulatory Visit: Payer: Medicare Other | Attending: Internal Medicine

## 2020-08-29 ENCOUNTER — Other Ambulatory Visit (HOSPITAL_BASED_OUTPATIENT_CLINIC_OR_DEPARTMENT_OTHER): Payer: Self-pay

## 2020-08-29 DIAGNOSIS — Z23 Encounter for immunization: Secondary | ICD-10-CM

## 2020-08-29 NOTE — Progress Notes (Signed)
   Covid-19 Vaccination Clinic  Name:  Randy Moreno    MRN: [id number] DOB: 1945/04/26  08/29/2020  Mr. Randy Moreno was observed post Covid-19 immunization for 15 minutes without incident. He was provided with Vaccine Information Sheet and instruction to access the V-Safe system.   Mr. Randy Moreno was instructed to call 911 with any severe reactions post vaccine: Difficulty breathing  Swelling of face and throat  A fast heartbeat  A bad rash all over body  Dizziness and weakness   Immunizations Administered     Name Date Dose VIS Date Route   PFIZER Comrnaty(Gray TOP) Covid-19 Vaccine 08/29/2020  2:34 PM 0.3 mL 02/23/2020 Intramuscular   Manufacturer: [place]   Lot: [id number]   [place]: [phone]

## 2020-12-06 ENCOUNTER — Other Ambulatory Visit: Payer: Self-pay | Admitting: Orthopedic Surgery

## 2020-12-06 DIAGNOSIS — M25511 Pain in right shoulder: Secondary | ICD-10-CM

## 2020-12-13 NOTE — Progress Notes (Signed)
Cardiology Office Note:   Date:  12/14/2020  NAME:  Randy Moreno    MRN: [id number] DOB:  1945/05/16   PCP:  Crist Infante, MD  Cardiologist:  Evalina Field, MD  Electrophysiologist:  None   Referring MD: Crist Infante, MD   Chief Complaint  Patient presents with   Follow-up    History of Present Illness:   Randy Moreno is a 75 y.o. male with a hx of CAD, HLD, HTN who presents for follow-up. Seen last year for very elevated calcium score.  He reports he is doing well since her last visit.  Continues to deny any chest pain symptoms.  He is walking 3-5 times per week.  He informs me up to 1 mile per day.  No chest tightness or pressure.  No shortness of breath reported.  His EKG in office demonstrates sinus rhythm with an incomplete right bundle branch block.  There are no acute ischemic changes.  Cardiovascular examination remains normal.  He informs me that his LDL cholesterol was in the 90s and his primary care physician increased his Lipitor to 40 mg daily.  He reports that he did not tolerate this.  He has been placed back on 20 mg of Lipitor as well as 10 mg of Zetia.  Primary care physician will recheck his cholesterol in a few months.  He presents with a blood pressure log.  BP values range between 169mmHg to 128 mmHg.  All values seem to be within range for him.  Overall doing well without any major complaints in office today.  Problem List 1. CAD -CAC score 2980 (99th percentile) -NM Stress normal 12/09/2019 2. HLD -Total cholesterol 150, HDL 63, LDL 64, triglycerides 113 3. HTN 4. Former tobacco abuse -AAA screening negative  Past Medical History: Past Medical History:  Diagnosis Date   Allergy    Arthritis    neck   Cataract    Gout    Hyperlipidemia    Hypertension     Past Surgical History: Past Surgical History:  Procedure Laterality Date   APPENDECTOMY  1986   CATARACT EXTRACTION     COLONOSCOPY     POLYPECTOMY     ROTATOR CUFF REPAIR Right    march 4     TOTAL SHOULDER ARTHROPLASTY Right 07/28/2019   Procedure: REVERSE TOTAL SHOULDER ARTHROPLASTY;  Surgeon: Tania Ade, MD;  Location: [facility];  Service: Orthopedics;  Laterality: Right;    Current Medications: Current Meds  Medication Sig   amLODipine (NORVASC) 5 MG tablet Take 5 mg by mouth daily.   ANDROGEL PUMP 20.25 MG/ACT (1.62%) GEL Apply 1 application topically daily.    aspirin 81 MG EC tablet    atorvastatin (LIPITOR) 20 MG tablet Take 20 mg by mouth daily.   cholecalciferol (VITAMIN D) 1000 UNITS tablet Take 1,000 Units by mouth daily.   colchicine 0.6 MG tablet Take 0.6 mg by mouth 2 (two) times daily as needed (gout).    COVID-19 mRNA vaccine, Pfizer, 30 MCG/0.3ML injection USE AS DIRECTED   Cyanocobalamin (B-12) 1000 MCG TBCR Take by mouth daily.   Cyanocobalamin (VITAMIN B 12 PO) Take 1 tablet by mouth daily.    EPINEPHrine 0.3 mg/0.3 mL IJ SOAJ injection use as directed for severe allergic reaction   escitalopram (LEXAPRO) 10 MG tablet Take 10 mg by mouth daily.   ezetimibe (ZETIA) 10 MG tablet    fish oil-omega-3 fatty acids 1000 MG capsule Take 2 g by mouth daily.  losartan-hydrochlorothiazide (HYZAAR) 100-12.5 MG per tablet Take 1 tablet by mouth daily.   Multiple Vitamin (MULTIVITAMIN) tablet Take 1 tablet by mouth daily.   saw palmetto 160 MG capsule Take 160 mg by mouth daily.   sildenafil (VIAGRA) 100 MG tablet 1 tablet as needed   [DISCONTINUED] sildenafil (REVATIO) 20 MG tablet TK 4 OR 5 TS PO 30  MINS TO 4 HOURS PRIOR TO SEXUAL ACTIVITY     Allergies:    Patient has no known allergies.   Social History: Social History   Socioeconomic History   Marital status: Married    Spouse name: Alice    Number of children: 2   Years of education: BS   Highest education level: Not on file  Occupational History   Occupation: Retired   Occupation: Retired Surveyor, quantity    Comment: [employer]  Tobacco Use   Smoking status: Former     Years: 20.00    Types: Cigarettes    Quit date: 07/22/1987    Years since quitting: 33.4   Smokeless tobacco: Never  Vaping Use   Vaping Use: Never used  Substance and Sexual Activity   Alcohol use: Yes    Alcohol/week: 28.0 standard drinks    Types: 28 Glasses of wine per week    Comment: daily   Drug use: No   Sexual activity: Not on file  Other Topics Concern   Not on file  Social History Narrative   Patient lives with wife [place].    patient has 2 children.    Patient has his BS   Patient drinks 2-4 glasses wife dialy.    Drinks caffeine daily.          Social Determinants of Health   Financial Resource Strain: Not on file  Food Insecurity: Not on file  Transportation Needs: Not on file  Physical Activity: Not on file  Stress: Not on file  Social Connections: Not on file     Family History: The patient's family history includes Colon cancer (age of onset: 54) in his mother; Diabetes in his father; Heart Problems in his paternal grandfather; Heart disease in his father and paternal uncle. There is no history of Esophageal cancer, Rectal cancer, or Stomach cancer.  ROS:   All other ROS reviewed and negative. Pertinent positives noted in the HPI.     EKGs/Labs/Other Studies Reviewed:   The following studies were personally reviewed by me today:  EKG:  EKG is ordered today.  The ekg ordered today demonstrates NSR 82, incomplete right bundle branch block, and was personally reviewed by me.   TTE 12/15/2019  1. Left ventricular ejection fraction, by estimation, is 60 to 65%. The  left ventricle has normal function. The left ventricle has no regional  wall motion abnormalities. There is mild left ventricular hypertrophy.  Left ventricular diastolic parameters  are consistent with Grade I diastolic dysfunction (impaired relaxation).   2. Right ventricular systolic function is normal. The right ventricular  size is normal. Tricuspid regurgitation signal is inadequate for  assessing  PA pressure.   3. The mitral valve is normal in structure. No evidence of mitral valve  regurgitation. No evidence of mitral stenosis.   4. The aortic valve is tricuspid. Aortic valve regurgitation is not  visualized. No aortic stenosis is present.   5. The inferior vena cava is normal in size with greater than 50%  respiratory variability, suggesting right atrial pressure of 3 mmHg.   Recent Labs: No  results found for requested labs within last 8760 hours.   Recent Lipid Panel No results found for: CHOL, TRIG, HDL, CHOLHDL, VLDL, LDLCALC, LDLDIRECT  Physical Exam:   VS:  BP (!) 143/87   Pulse 82   Ht 5' 7.5" (1.715 m)   Wt 192 lb 9.6 oz (87.4 kg)   SpO2 98%   BMI 29.72 kg/m    Wt Readings from Last 3 Encounters:  12/14/20 192 lb 9.6 oz (87.4 kg)  12/09/19 194 lb (88 kg)  12/02/19 194 lb (88 kg)    General: Well nourished, well developed, in no acute distress Head: Atraumatic, normal size  Eyes: PEERLA, EOMI  Neck: Supple, no JVD Endocrine: No thryomegaly Cardiac: Normal S1, S2; RRR; no murmurs, rubs, or gallops Lungs: Clear to auscultation bilaterally, no wheezing, rhonchi or rales  Abd: Soft, nontender, no hepatomegaly  Ext: No edema, pulses 2+ Musculoskeletal: No deformities, BUE and BLE strength normal and equal Skin: Warm and dry, no rashes   Neuro: Alert and oriented to person, place, time, and situation, CNII-XII grossly intact, no focal deficits  Psych: Normal mood and affect   ASSESSMENT:   Randy Moreno is a 75 y.o. male who presents for the following: 1. Coronary artery disease involving native coronary artery of native heart without angina pectoris   2. Mixed hyperlipidemia   3. Essential hypertension     PLAN:   1. Coronary artery disease involving native coronary artery of native heart without angina pectoris 2. Mixed hyperlipidemia -Very elevated calcium score 2980 which is a 99 percentile.  Negative nuclear medicine stress test.  Echo was  normal as well.  No chest pain symptoms.  Would recommend continue aspirin 81 mg daily.  LDL was above 70 on most recent lab draw.  He is on Lipitor 20 mg daily and did not tolerate higher doses.  10 mg of Zetia has been added.  Plans to recheck this in the next few months.  I have asked him to make sure that his primary care physician forwarded these results to our office.  He overall is doing well without any chest pain symptoms.  I recommended continue exercise as well as proper diet.  Seems to be doing well.  He will see [place] yearly.  3. Essential hypertension -No change in blood pressure medications.  Disposition: Return in about 1 year (around 12/14/2021).  Medication Adjustments/Labs and Tests Ordered: Current medicines are reviewed at length with the patient today.  Concerns regarding medicines are outlined above.  Orders Placed This Encounter  Procedures   EKG 12-Lead   No orders of the defined types were placed in this encounter.   Patient Instructions  Medication Instructions:  The current medical regimen is effective;  continue present plan and medications.  *If you need a refill on your cardiac medications before your next appointment, please call your pharmacy*  Follow-Up: At [facility], you and your health needs are our priority.  As part of our continuing mission to provide you with exceptional heart care, we have created designated Provider Care Teams.  These Care Teams include your primary Cardiologist (physician) and Advanced Practice Providers (APPs -  Physician Assistants and Nurse Practitioners) who all work together to provide you with the care you need, when you need it.  We recommend signing up for the patient portal called "MyChart".  Sign up information is provided on this After Visit Summary.  MyChart is used to connect with patients for Virtual Visits (Telemedicine).  Patients  are able to view lab/test results, encounter notes, upcoming appointments, etc.   Non-urgent messages can be sent to your provider as well.   To learn more about what you can do with MyChart, go to [email or web address].    Your next appointment:   12 month(s)  The format for your next appointment:   In Person  Provider:   Eleonore Chiquito, MD     Time Spent with Patient: I have spent a total of 25 minutes with patient reviewing hospital notes, telemetry, EKGs, labs and examining the patient as well as establishing an assessment and plan that was discussed with the patient.  > 50% of time was spent in direct patient care.  Signed, Addison Naegeli. Audie Box, MD, [place]  [address] [place],  [address] [phone]  12/14/2020 3:51 PM

## 2020-12-14 ENCOUNTER — Ambulatory Visit: Payer: Medicare Other | Attending: Internal Medicine

## 2020-12-14 ENCOUNTER — Other Ambulatory Visit (HOSPITAL_BASED_OUTPATIENT_CLINIC_OR_DEPARTMENT_OTHER): Payer: Self-pay

## 2020-12-14 ENCOUNTER — Encounter (HOSPITAL_BASED_OUTPATIENT_CLINIC_OR_DEPARTMENT_OTHER): Payer: Self-pay | Admitting: Cardiovascular Disease

## 2020-12-14 ENCOUNTER — Other Ambulatory Visit: Payer: Self-pay

## 2020-12-14 ENCOUNTER — Ambulatory Visit (HOSPITAL_BASED_OUTPATIENT_CLINIC_OR_DEPARTMENT_OTHER): Payer: Medicare Other | Admitting: Cardiovascular Disease

## 2020-12-14 VITALS — BP 143/87 | HR 82 | Ht 67.5 in | Wt 192.6 lb

## 2020-12-14 DIAGNOSIS — I10 Essential (primary) hypertension: Secondary | ICD-10-CM

## 2020-12-14 DIAGNOSIS — Z23 Encounter for immunization: Secondary | ICD-10-CM

## 2020-12-14 DIAGNOSIS — E782 Mixed hyperlipidemia: Secondary | ICD-10-CM | POA: Diagnosis not present

## 2020-12-14 DIAGNOSIS — I2510 Atherosclerotic heart disease of native coronary artery without angina pectoris: Secondary | ICD-10-CM

## 2020-12-14 MED ORDER — PFIZER COVID-19 VAC BIVALENT 30 MCG/0.3ML IM SUSP
INTRAMUSCULAR | 0 refills | Status: DC
  Filled 2020-12-14: qty 0.3, 1d supply, fill #0

## 2020-12-14 NOTE — Progress Notes (Signed)
   Covid-19 Vaccination Clinic  Name:  TEVITA GOMER    MRN: [id number] DOB: September 05, 1945  12/14/2020  Mr. Muhammed was observed post Covid-19 immunization for 15 minutes without incident. He was provided with Vaccine Information Sheet and instruction to access the V-Safe system.   Mr. Minus was instructed to call 911 with any severe reactions post vaccine: Difficulty breathing  Swelling of face and throat  A fast heartbeat  A bad rash all over body  Dizziness and weakness

## 2020-12-14 NOTE — Patient Instructions (Signed)

## 2020-12-24 ENCOUNTER — Ambulatory Visit
Admission: RE | Admit: 2020-12-24 | Discharge: 2020-12-24 | Disposition: A | Discharge status: Home / Self Care | Payer: Medicare Other | Source: Ambulatory Visit | Attending: Orthopedic Surgery | Admitting: Orthopedic Surgery

## 2020-12-24 ENCOUNTER — Other Ambulatory Visit: Payer: Self-pay

## 2020-12-24 DIAGNOSIS — M25511 Pain in right shoulder: Secondary | ICD-10-CM

## 2021-07-13 IMAGING — DX DG CHEST 2V
2 series · 2 of 2 positions shown · non-contrast
Comparison: 09/21/2018

CLINICAL DATA: Preoperative assessment for right shoulder surgery,
hypertension, previous tobacco abuse

EXAM:
CHEST - 2 VIEW

[chest pa]
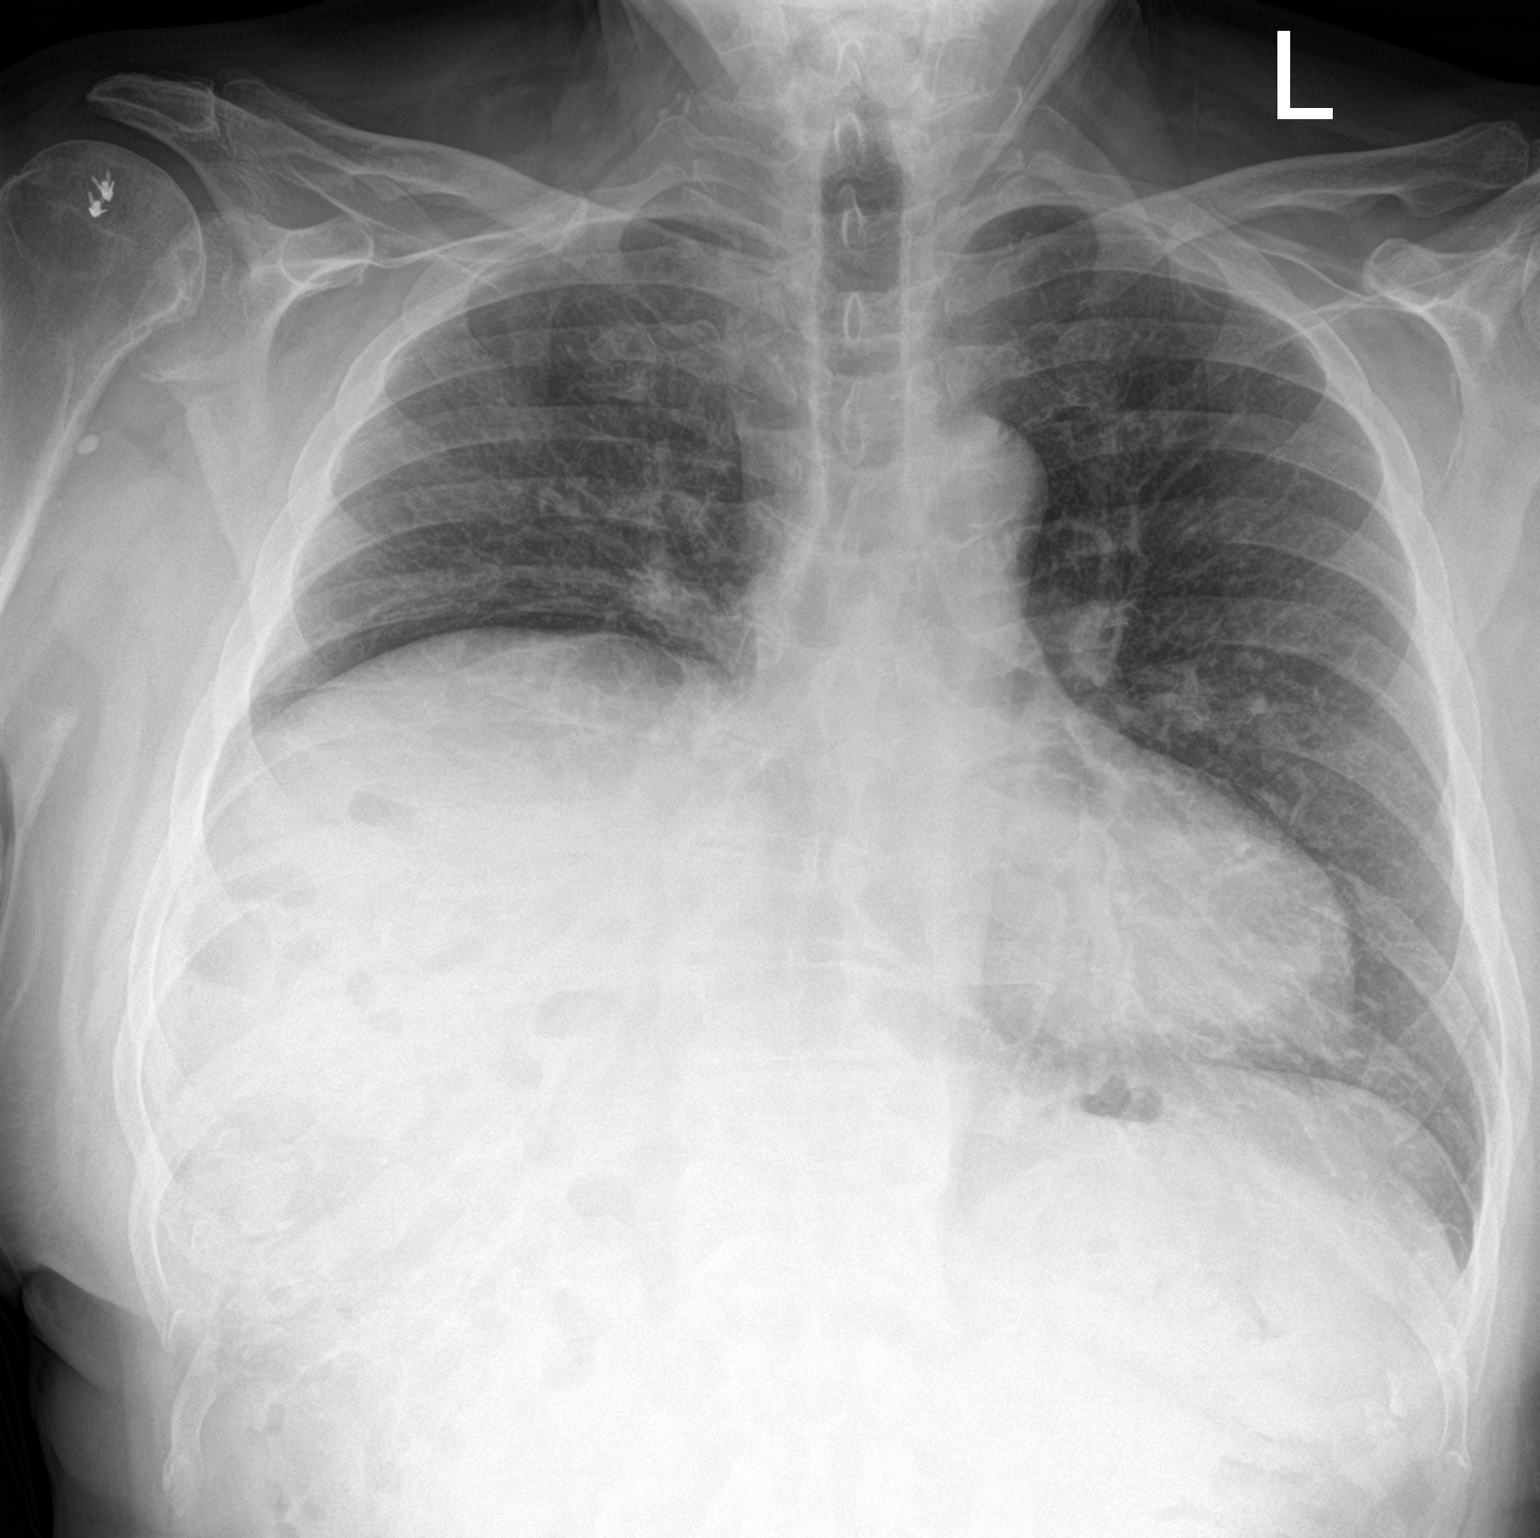

[chest lat]
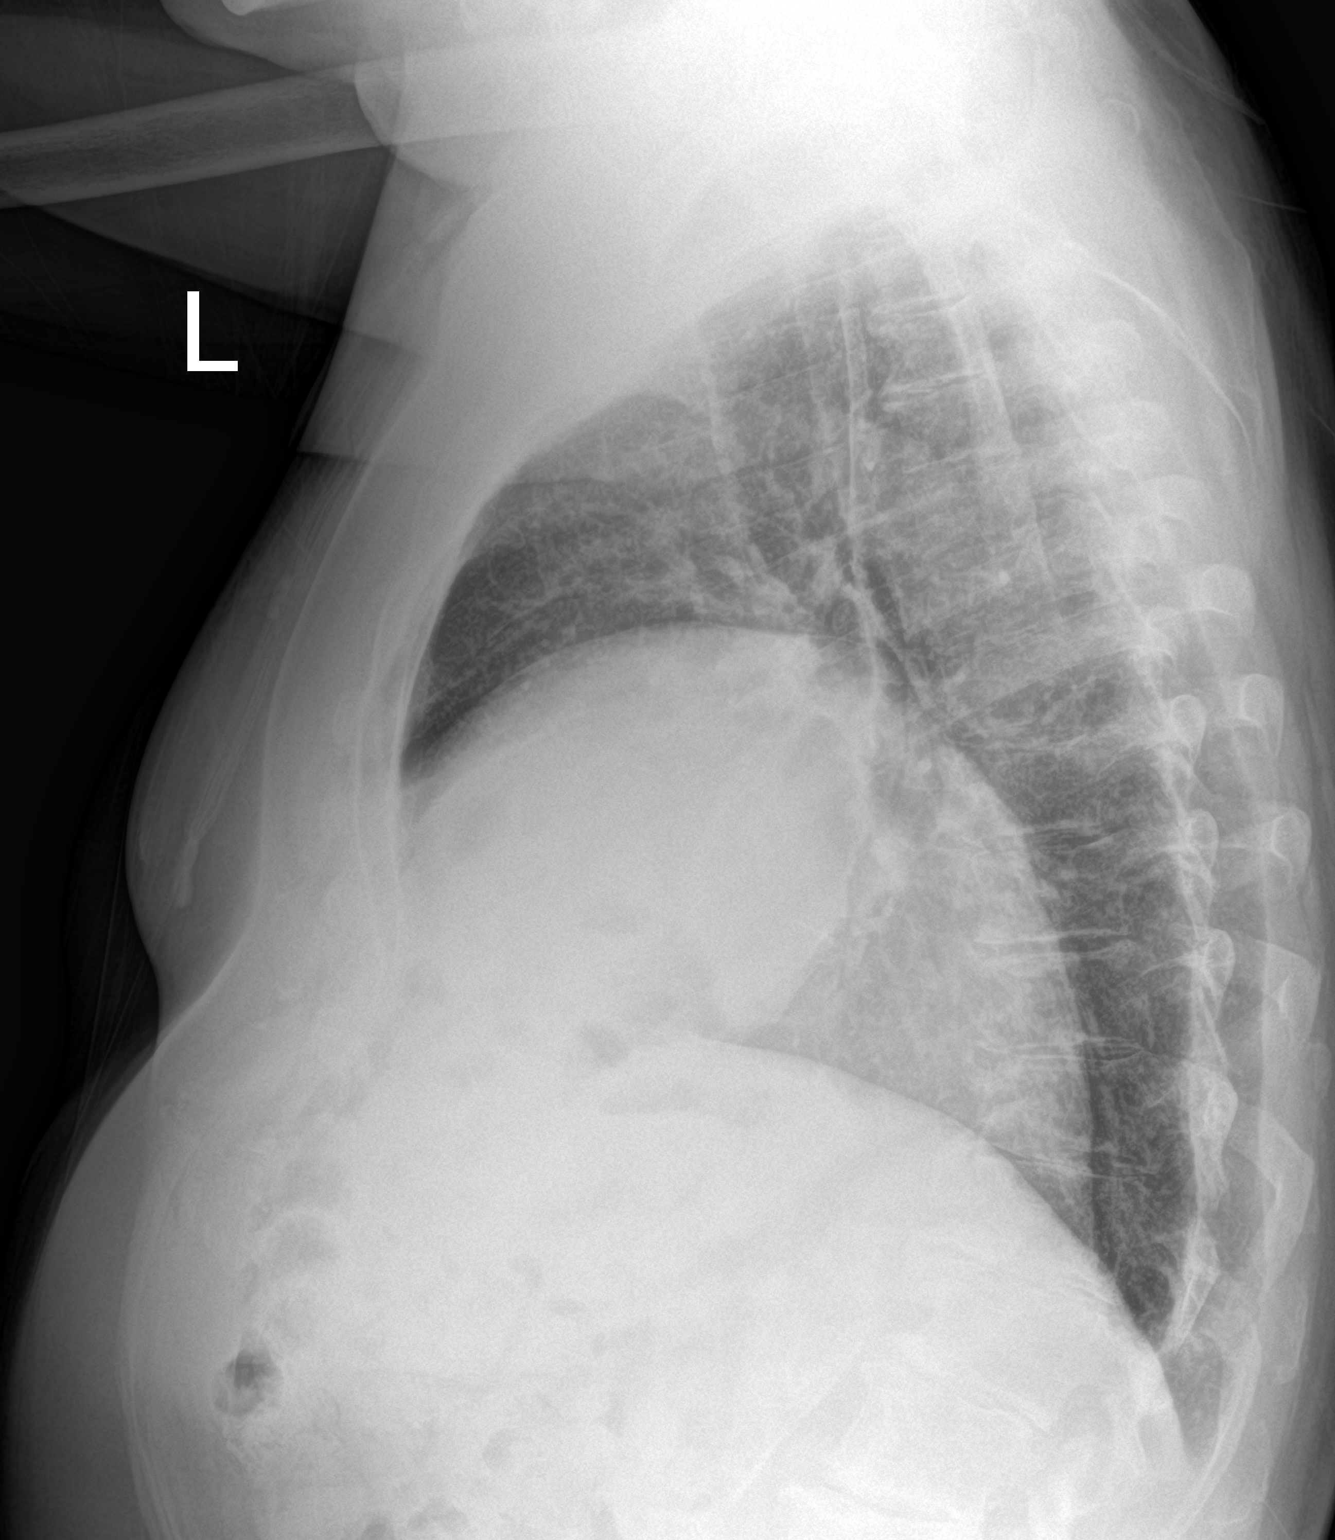

[2 of 2 positions shown; findings below may reference images not displayed]

FINDINGS: Frontal and lateral views of the chest demonstrate an unremarkable
cardiac silhouette. Chronic elevation of the right hemidiaphragm. No
airspace disease, effusion, or pneumothorax. No acute bony
abnormalities. Postsurgical changes right shoulder.
IMPRESSION: 1. No acute intrathoracic process.

## 2021-12-30 NOTE — Progress Notes (Unsigned)
Cardiology Office Note:   Date:  12/31/2021  NAME:  Randy Moreno    MRN: [id number] DOB:  1945/09/10   PCP:  Crist Infante, MD  Cardiologist:  Evalina Field, MD  Electrophysiologist:  None   Referring MD: Crist Infante, MD   Chief Complaint  Patient presents with   Follow-up    12 months.   History of Present Illness:   Randy Moreno is a 76 y.o. male with a hx of CAD who presents for follow-up.  He reports he is doing well.  Denies any chest pain or trouble breathing.  He is walking every day 1 mile.  No limitations.  LDL cholesterol is at goal.  Blood pressure is at goal.  He is on aspirin without any issues.  All of his values are at goal and he is without symptoms of angina.  He is a retired Sports coach in [employer] 1. CAD -CAC score 2980 (99th percentile) -NM Stress normal 12/09/2019 2. HLD -Total chol 113, HDL 82, LDL 25, triglycerides 31 3. HTN 4. Former tobacco abuse -AAA screening negative  Past Medical History: Past Medical History:  Diagnosis Date   Allergy    Arthritis    neck   Cataract    Gout    Hyperlipidemia    Hypertension     Past Surgical History: Past Surgical History:  Procedure Laterality Date   APPENDECTOMY  1986   CATARACT EXTRACTION     COLONOSCOPY     POLYPECTOMY     ROTATOR CUFF REPAIR Right    march 4    TOTAL SHOULDER ARTHROPLASTY Right 07/28/2019   Procedure: REVERSE TOTAL SHOULDER ARTHROPLASTY;  Surgeon: Tania Ade, MD;  Location: [facility];  Service: Orthopedics;  Laterality: Right;    Current Medications: Current Meds  Medication Sig   amLODipine (NORVASC) 5 MG tablet Take 5 mg by mouth daily.   ANDROGEL PUMP 20.25 MG/ACT (1.62%) GEL Apply 1 application topically daily.    aspirin 81 MG EC tablet    atorvastatin (LIPITOR) 20 MG tablet Take 20 mg by mouth daily.   cholecalciferol (VITAMIN D) 1000 UNITS tablet Take 1,000 Units by mouth daily.   COVID-19 mRNA bivalent vaccine, Pfizer, (PFIZER COVID-19  VAC BIVALENT) injection Inject into the muscle.   Cyanocobalamin (VITAMIN B 12 PO) Take 1 tablet by mouth daily.    EPINEPHrine 0.3 mg/0.3 mL IJ SOAJ injection use as directed for severe allergic reaction   escitalopram (LEXAPRO) 10 MG tablet Take 10 mg by mouth daily.   fish oil-omega-3 fatty acids 1000 MG capsule Take 2 g by mouth daily.   losartan-hydrochlorothiazide (HYZAAR) 100-12.5 MG per tablet Take 1 tablet by mouth daily.   Multiple Vitamin (MULTIVITAMIN) tablet Take 1 tablet by mouth daily.   saw palmetto 160 MG capsule Take 160 mg by mouth daily.   sildenafil (VIAGRA) 100 MG tablet 1 tablet as needed     Allergies:    Patient has no known allergies.   Social History: Social History   Socioeconomic History   Marital status: Married    Spouse name: Alice    Number of children: 2   Years of education: BS   Highest education level: Not on file  Occupational History   Occupation: Retired   Occupation: Retired Surveyor, quantity    Comment: [employer]  Tobacco Use   Smoking status: Former    Years: 20.00    Types: Cigarettes  Quit date: 07/22/1987    Years since quitting: 34.4   Smokeless tobacco: Never  Vaping Use   Vaping Use: Never used  Substance and Sexual Activity   Alcohol use: Yes    Alcohol/week: 28.0 standard drinks of alcohol    Types: 28 Glasses of wine per week    Comment: daily   Drug use: No   Sexual activity: Not on file  Other Topics Concern   Not on file  Social History Narrative   Patient lives with wife [name].    patient has 2 children.    Patient has his BS   Patient drinks 2-4 glasses wife dialy.    Drinks caffeine daily.          Social Determinants of Health   Financial Resource Strain: Not on file  Food Insecurity: Not on file  Transportation Needs: Not on file  Physical Activity: Not on file  Stress: Not on file  Social Connections: Not on file     Family History: The patient's family history includes Colon  cancer (age of onset: 47) in his mother; Diabetes in his father; Heart Problems in his paternal grandfather; Heart disease in his father and paternal uncle. There is no history of Esophageal cancer, Rectal cancer, or Stomach cancer.  ROS:   All other ROS reviewed and negative. Pertinent positives noted in the HPI.     EKGs/Labs/Other Studies Reviewed:   The following studies were personally reviewed by me today:  Recent Labs: No results found for requested labs within last 365 days.   Recent Lipid Panel No results found for: "CHOL", "TRIG", "HDL", "CHOLHDL", "VLDL", "LDLCALC", "LDLDIRECT"  Physical Exam:   VS:  BP (!) 118/52 (BP Location: Left Arm, Patient Position: Sitting, Cuff Size: Normal)   Pulse 70   Ht '5\' 7"'$  (1.702 m)   Wt 187 lb (84.8 kg)   BMI 29.29 kg/m    Wt Readings from Last 3 Encounters:  12/31/21 187 lb (84.8 kg)  12/14/20 192 lb 9.6 oz (87.4 kg)  12/09/19 194 lb (88 kg)    General: Well nourished, well developed, in no acute distress Head: Atraumatic, normal size  Eyes: PEERLA, EOMI  Neck: Supple, no JVD Endocrine: No thryomegaly Cardiac: Normal S1, S2; RRR; no murmurs, rubs, or gallops Lungs: Clear to auscultation bilaterally, no wheezing, rhonchi or rales  Abd: Soft, nontender, no hepatomegaly  Ext: No edema, pulses 2+ Musculoskeletal: No deformities, BUE and BLE strength normal and equal Skin: Warm and dry, no rashes   Neuro: Alert and oriented to person, place, time, and situation, CNII-XII grossly intact, no focal deficits  Psych: Normal mood and affect   ASSESSMENT:   Randy Moreno is a 76 y.o. male who presents for the following: 1. Coronary artery disease involving native coronary artery of native heart without angina pectoris   2. Mixed hyperlipidemia   3. Essential hypertension     PLAN:   1. Coronary artery disease involving native coronary artery of native heart without angina pectoris 2. Mixed hyperlipidemia -Severely elevated calcium  score.  Negative stress test.  Normal heart function.  He will continue aspirin and Lipitor.  His LDL cholesterol is at goal.  He is exercising enough.  His blood pressure is well controlled.  No change to medications.  3. Essential hypertension -Continue current medications.  No changes.      Disposition: Return in about 1 year (around 01/01/2023).  Medication Adjustments/Labs and Tests Ordered: Current medicines are reviewed at length with  the patient today.  Concerns regarding medicines are outlined above.  No orders of the defined types were placed in this encounter.  No orders of the defined types were placed in this encounter.   Patient Instructions  Medication Instructions:  The current medical regimen is effective;  continue present plan and medications.  *If you need a refill on your cardiac medications before your next appointment, please call your pharmacy*   Follow-Up: At [facility], you and your health needs are our priority.  As part of our continuing mission to provide you with exceptional heart care, we have created designated Provider Care Teams.  These Care Teams include your primary Cardiologist (physician) and Advanced Practice Providers (APPs -  Physician Assistants and Nurse Practitioners) who all work together to provide you with the care you need, when you need it.  We recommend signing up for the patient portal called "MyChart".  Sign up information is provided on this After Visit Summary.  MyChart is used to connect with patients for Virtual Visits (Telemedicine).  Patients are able to view lab/test results, encounter notes, upcoming appointments, etc.  Non-urgent messages can be sent to your provider as well.   To learn more about what you can do with MyChart, go to [email or web address].    Your next appointment:   12 month(s)  The format for your next appointment:   In Person  Provider:   Evalina Field, MD           Time Spent  with Patient: I have spent a total of 25 minutes with patient reviewing hospital notes, telemetry, EKGs, labs and examining the patient as well as establishing an assessment and plan that was discussed with the patient.  > 50% of time was spent in direct patient care.  Signed, Addison Naegeli. Audie Box, MD, [place]  [address] [place], [place] [address] [phone]  12/31/2021 10:40 AM

## 2021-12-31 ENCOUNTER — Ambulatory Visit: Payer: Medicare Other | Attending: Cardiovascular Disease | Admitting: Cardiovascular Disease

## 2021-12-31 ENCOUNTER — Encounter: Payer: Self-pay | Admitting: Cardiovascular Disease

## 2021-12-31 VITALS — BP 118/52 | HR 70 | Ht 67.0 in | Wt 187.0 lb

## 2021-12-31 DIAGNOSIS — I2510 Atherosclerotic heart disease of native coronary artery without angina pectoris: Secondary | ICD-10-CM

## 2021-12-31 DIAGNOSIS — I10 Essential (primary) hypertension: Secondary | ICD-10-CM | POA: Diagnosis not present

## 2021-12-31 DIAGNOSIS — E782 Mixed hyperlipidemia: Secondary | ICD-10-CM

## 2021-12-31 NOTE — Patient Instructions (Signed)

## 2022-03-17 HISTORY — PX: SHOULDER SURGERY: SHX246

## 2022-04-30 ENCOUNTER — Telehealth: Payer: Self-pay

## 2022-04-30 NOTE — Telephone Encounter (Signed)
   Name: Randy Moreno  DOB: [birth year]  MRN: [id number]  Primary Cardiologist: Evalina Field, MD   Preoperative team, please contact this patient and set up a phone call appointment for further preoperative risk assessment. Please obtain consent and complete medication review. Thank you for your help.  I confirm that guidance regarding antiplatelet and oral anticoagulation therapy has been completed and, if necessary, noted below.  Regarding ASA therapy, it may be stopped 5-7 days prior to surgery with a plan to resume it as soon as felt to be feasible from a surgical standpoint in the post-operative period.    [name], [name], NP 04/30/2022, 11:15 AM [place]

## 2022-04-30 NOTE — Telephone Encounter (Signed)
  Patient Consent for Virtual Visit         Randy Moreno has provided verbal consent on 04/30/2022 for a virtual visit (video or telephone).   CONSENT FOR VIRTUAL VISIT FOR:  Randy Moreno  By participating in this virtual visit I agree to the following:  I hereby voluntarily request, consent and authorize [facility] and its employed or contracted physicians, physician assistants, nurse practitioners or other licensed health care professionals (the Practitioner), to provide me with telemedicine health care services (the "Services") as deemed necessary by the treating Practitioner. I acknowledge and consent to receive the Services by the Practitioner via telemedicine. I understand that the telemedicine visit will involve communicating with the Practitioner through live audiovisual communication technology and the disclosure of certain medical information by electronic transmission. I acknowledge that I have been given the opportunity to request an in-person assessment or other available alternative prior to the telemedicine visit and am voluntarily participating in the telemedicine visit.  I understand that I have the right to withhold or withdraw my consent to the use of telemedicine in the course of my care at any time, without affecting my right to future care or treatment, and that the Practitioner or I may terminate the telemedicine visit at any time. I understand that I have the right to inspect all information obtained and/or recorded in the course of the telemedicine visit and may receive copies of available information for a reasonable fee.  I understand that some of the potential risks of receiving the Services via telemedicine include:  Delay or interruption in medical evaluation due to technological equipment failure or disruption; Information transmitted may not be sufficient (e.g. poor resolution of images) to allow for appropriate medical decision making by the Practitioner;  and/or  In rare instances, security protocols could fail, causing a breach of personal health information.  Furthermore, I acknowledge that it is my responsibility to provide information about my medical history, conditions and care that is complete and accurate to the best of my ability. I acknowledge that Practitioner's advice, recommendations, and/or decision may be based on factors not within their control, such as incomplete or inaccurate data provided by me or distortions of diagnostic images or specimens that may result from electronic transmissions. I understand that the practice of medicine is not an exact science and that Practitioner makes no warranties or guarantees regarding treatment outcomes. I acknowledge that a copy of this consent can be made available to me via my patient portal ([facility] [facility]), or I can request a printed copy by calling the office of [facility].    I understand that my insurance will be billed for this visit.   I have read or had this consent read to me. I understand the contents of this consent, which adequately explains the benefits and risks of the Services being provided via telemedicine.  I have been provided ample opportunity to ask questions regarding this consent and the Services and have had my questions answered to my satisfaction. I give my informed consent for the services to be provided through the use of telemedicine in my medical care

## 2022-04-30 NOTE — Telephone Encounter (Signed)
Callback team can you please contact requesting provider and check to see if he would like him to hold his ASA 81 mg.  This medication was not mentioned in the preoperative requests but he is currently taking.  Thank you

## 2022-04-30 NOTE — Telephone Encounter (Signed)
Spoke with the patient who is agreeable with telehealth appointment.    Med list reviewed and consent given. Patient voiced understanding of process and what to expect for telehealth appt.

## 2022-04-30 NOTE — Telephone Encounter (Signed)
   Pre-operative Risk Assessment    Patient Name: RAEDEN SCHIPPERS  DOB: 1945-12-08 MRN: [id number]      Request for Surgical Clearance    Procedure:   Right Shoulder Scope  Date of Surgery:  Clearance TBD                                 Surgeon:  Dr Ophelia Charter Surgeon's Group or Practice Name:  [facility] Phone number:  [phone]  [phone] Derek Jack Fax number:  [phone]   Type of Clearance Requested:   - Medical    Type of Anesthesia:  General    Additional requests/questions:   N/A  SignedJamelle Haring   04/30/2022, 10:21 AM

## 2022-04-30 NOTE — Telephone Encounter (Signed)
Spoke with Sherri at [employer] and she states they would like for the patient to hold the Aspirin if possible.   Advised that I would pass this message along and we will get back to her she voiced understanding.

## 2022-05-05 NOTE — Progress Notes (Unsigned)
Virtual Visit via Telephone Note   Because of Randy Moreno's co-morbid illnesses, he is at least at moderate risk for complications without adequate follow up.  This format is felt to be most appropriate for this patient at this time.  The patient did not have access to video technology/had technical difficulties with video requiring transitioning to audio format only (telephone).  All issues noted in this document were discussed and addressed.  No physical exam could be performed with this format.  Please refer to the patient's chart for his consent to telehealth for [facility].  Evaluation Performed:  Preoperative cardiovascular risk assessment _____________   Date:  05/05/2022   Patient ID:  Randy Moreno, DOB 1945-12-08, MRN [id number] Patient Location:  Home Provider location:   Office  Primary Care Provider:  Crist Infante, MD Primary Cardiologist:  Evalina Field, MD  Chief Complaint / Patient Profile   77 y.o. y/o male with a h/o CAD with elevated CAC score 2980, normal nuclear stress test 12/09/2019, HLD, HTN, former tobacco abuse who is pending right shoulder scope and presents today for telephonic preoperative cardiovascular risk assessment.  History of Present Illness    Randy Moreno is a 77 y.o. male who presents via audio/video conferencing for a telehealth visit today.  Pt was last seen in cardiology clinic on 12/31/2021 by Dr. Audie Box.  At that time [name] was doing well.  The patient is now pending procedure as outlined above. Since his last visit, he *** denies chest pain, shortness of breath, lower extremity edema, fatigue, palpitations, melena, hematuria, hemoptysis, diaphoresis, weakness, presyncope, syncope, orthopnea, and PND.   Past Medical History    Past Medical History:  Diagnosis Date   Allergy    Arthritis    neck   Cataract    Gout    Hyperlipidemia    Hypertension    Past Surgical History:  Procedure Laterality Date    APPENDECTOMY  1986   CATARACT EXTRACTION     COLONOSCOPY     POLYPECTOMY     ROTATOR CUFF REPAIR Right    march 4    TOTAL SHOULDER ARTHROPLASTY Right 07/28/2019   Procedure: REVERSE TOTAL SHOULDER ARTHROPLASTY;  Surgeon: Tania Ade, MD;  Location: [facility];  Service: Orthopedics;  Laterality: Right;    Allergies  No Known Allergies  Home Medications    Prior to Admission medications   Medication Sig Start Date End Date Taking? Authorizing Provider  amLODipine (NORVASC) 5 MG tablet Take 5 mg by mouth daily.    [provider]  ANDROGEL PUMP 20.25 MG/ACT (1.62%) GEL Apply 1 application topically daily.  08/10/17   [provider]  atorvastatin (LIPITOR) 20 MG tablet Take 20 mg by mouth daily.    [provider]  cholecalciferol (VITAMIN D) 1000 UNITS tablet Take 1,000 Units by mouth daily.    [provider]  Cyanocobalamin (VITAMIN B 12 PO) Take 1 tablet by mouth daily.     [provider]  EPINEPHrine 0.3 mg/0.3 mL IJ SOAJ injection use as directed for severe allergic reaction 04/24/14   [provider]  escitalopram (LEXAPRO) 10 MG tablet Take 10 mg by mouth daily.    [provider]  fish oil-omega-3 fatty acids 1000 MG capsule Take 2 g by mouth daily.    [provider]  losartan-hydrochlorothiazide (HYZAAR) 100-12.5 MG per tablet Take 1 tablet by mouth daily.    [provider]  Multiple Vitamin (MULTIVITAMIN)  tablet Take 1 tablet by mouth daily.    [provider]  REPATHA SURECLICK XX123456 MG/ML SOAJ Inject into the skin every 14 (fourteen) days. 04/06/22   [provider]  saw palmetto 160 MG capsule Take 160 mg by mouth daily.    [provider]  sildenafil (VIAGRA) 100 MG tablet 1 tablet as needed    [provider]    Physical Exam    Vital Signs:  DONDRE ROZAK does not have vital signs available for review today.***  Given telephonic nature of communication,  physical exam is limited. AAOx3. NAD. Normal affect.  Speech and respirations are unlabored.  Accessory Clinical Findings    None  Assessment & Plan    1.  Preoperative Cardiovascular Risk Assessment:  The patient was advised that if he develops new symptoms prior to surgery to contact our office to arrange for a follow-up visit, and he verbalized understanding.  Per office protocol, he may hold aspirin for 5-7 days prior to procedure and should resume as soon as hemodynamically stable postoperatively.  A copy of this note will be routed to requesting surgeon.  Time:   Today, I have spent *** minutes with the patient with telehealth technology discussing medical history, symptoms, and management plan.     Emmaline Life, NP  05/05/2022, 7:50 AM

## 2022-05-06 ENCOUNTER — Encounter: Payer: Self-pay | Admitting: Nurse Practitioner

## 2022-05-06 ENCOUNTER — Ambulatory Visit: Payer: Medicare Other | Attending: Cardiovascular Disease | Admitting: Nurse Practitioner

## 2022-05-06 DIAGNOSIS — Z01810 Encounter for preprocedural cardiovascular examination: Secondary | ICD-10-CM

## 2022-05-20 ENCOUNTER — Encounter: Payer: Self-pay | Admitting: Neurology

## 2022-06-10 ENCOUNTER — Encounter: Payer: Self-pay | Admitting: Internal Medicine

## 2022-06-16 ENCOUNTER — Ambulatory Visit: Payer: Medicare Other | Admitting: Neurology

## 2022-06-26 ENCOUNTER — Encounter (HOSPITAL_BASED_OUTPATIENT_CLINIC_OR_DEPARTMENT_OTHER): Payer: Self-pay

## 2022-06-26 ENCOUNTER — Ambulatory Visit (HOSPITAL_BASED_OUTPATIENT_CLINIC_OR_DEPARTMENT_OTHER): Admit: 2022-06-26 | Payer: Medicare Other | Admitting: Orthopaedic Surgery

## 2022-06-26 SURGERY — ARTHROSCOPY, SHOULDER
Anesthesia: General | Site: Shoulder | Laterality: Right

## 2022-07-04 ENCOUNTER — Ambulatory Visit (HOSPITAL_BASED_OUTPATIENT_CLINIC_OR_DEPARTMENT_OTHER)
Admission: RE | Admit: 2022-07-04 | Discharge: 2022-07-04 | Disposition: A | Discharge status: Home / Self Care | Payer: Medicare Other | Source: Ambulatory Visit | Attending: Registered Nurse | Admitting: Registered Nurse

## 2022-07-04 ENCOUNTER — Other Ambulatory Visit (HOSPITAL_COMMUNITY): Payer: Self-pay | Admitting: Registered Nurse

## 2022-07-04 DIAGNOSIS — R519 Headache, unspecified: Secondary | ICD-10-CM | POA: Diagnosis present

## 2022-07-04 MED ORDER — GADOBUTROL 1 MMOL/ML IV SOLN
7.5000 mL | Freq: Once | INTRAVENOUS | Status: AC | PRN
  Administered 2022-07-04: 7.5 mL via INTRAVENOUS
  Filled 2022-07-04: qty 7.5

## 2022-07-21 ENCOUNTER — Ambulatory Visit: Payer: Medicare Other | Admitting: Neurology

## 2022-07-28 NOTE — Progress Notes (Addendum)
Cardiology Office Note:    Date:  08/04/2022   ID:  Randy Moreno, DOB 29-Mar-1945, MRN [id number]  PCP:  Rodrigo Ran, MD   [facility] Providers Cardiologist:  Reatha Harps, MD      Referring MD: Townsend Roger*   Chief Complaint: fatigue  History of Present Illness:    Randy Moreno is a very pleasant 77 y.o. male with a hx of CAD with elevated CAC score 2980, normal nuclear stress test 11/2019, HLD, HTN, and former tobacco abuse.   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 Underwent CT calcium score 10/2019 which revealed CAC 2980 (99th percentile), aortic atherosclerosis and was referred to cardiology.  He was seen by Dr. Flora Lipps on 12/02/2019.  He was on Lipitor 40 mg daily with most recent LDL cholesterol 64.  History of heart disease in his father.  He had negative nuclear stress test 11/2019. AAA screening for former smoker was negative.   Last cardiology clinic visit was 12/31/21 with Dr. Flora Lipps. He reported he was walking every day 1 mile with no limitations. No changes were made to treatment regimen and 1 year follow-up was recommended.  Today, he is here for evaluation of symptoms of fatigue, lightheadedness since March 2024. Referred by PCP for further evaluation. Had shoulder surgery April 2024, went well. Had COVID infection in March and since that time feels "drowsy, and like he has brain fog." BP has been well-controlled, no evidence of hypotension or increased light headedness with position changes. Has not been walking for exercise since November 2023, limited by hip pain and weather. Admits to no other exertional activities. Weight loss of 17 lb, intentional with dietary changes, portion control.  He denies chest pain, shortness of breath, edema, orthopnea, PND, presyncope, syncope. No symptoms walking in today but currently feeling "drowsy, not quite thinking normally." Feels like symptoms occur more often when he is at rest. Is sleeping well, no concerns.  Extensive work-up with PCP, lab results, MR of brain without significant abnormality. He stays well hydrated.   Past Medical History:  Diagnosis Date   Allergy    Arthritis    neck   Cataract    Gout    Hyperlipidemia    Hypertension     Past Surgical History:  Procedure Laterality Date   APPENDECTOMY  1986   CATARACT EXTRACTION     COLONOSCOPY     POLYPECTOMY     ROTATOR CUFF REPAIR Right    march 4    TOTAL SHOULDER ARTHROPLASTY Right 07/28/2019   Procedure: REVERSE TOTAL SHOULDER ARTHROPLASTY;  Surgeon: Jones Broom, MD;  Location: [facility];  Service: Orthopedics;  Laterality: Right;    Current Medications: Current Meds  Medication Sig   amLODipine (NORVASC) 5 MG tablet Take 5 mg by mouth daily.   ANDROGEL PUMP 20.25 MG/ACT (1.62%) GEL Apply 1 application topically daily.    atorvastatin (LIPITOR) 20 MG tablet Take 20 mg by mouth daily.   cholecalciferol (VITAMIN D) 1000 UNITS tablet Take 1,000 Units by mouth daily.   Cyanocobalamin (VITAMIN B 12 PO) Take 1 tablet by mouth daily.    EPINEPHrine 0.3 mg/0.3 mL IJ SOAJ injection use as directed for severe allergic reaction   escitalopram (LEXAPRO) 10 MG tablet Take 10 mg by mouth daily.   fish oil-omega-3 fatty acids 1000 MG capsule Take 2 g by mouth daily.   losartan-hydrochlorothiazide (HYZAAR) 100-12.5 MG per tablet Take 1 tablet by mouth daily.  metoprolol tartrate (LOPRESSOR) 50 MG tablet Take one (1) tablet by mouth ( 50 mg) 2 hours prior to CT scan.   Multiple Vitamin (MULTIVITAMIN) tablet Take 1 tablet by mouth daily.   REPATHA SURECLICK 140 MG/ML SOAJ Inject into the skin every 14 (fourteen) days.   saw palmetto 160 MG capsule Take 160 mg by mouth daily.   sildenafil (VIAGRA) 100 MG tablet 1 tablet as needed     Allergies:   Patient has no known allergies.   Social History   Socioeconomic History   Marital status: Married    Spouse name: Alice    Number of children: 2   Years of education: BS   Highest  education level: Not on file  Occupational History   Occupation: Retired   Occupation: Retired Field seismologist    Comment: [employer]  Tobacco Use   Smoking status: Former    Years: 20    Types: Cigarettes    Quit date: 07/22/1987    Years since quitting: 35.0   Smokeless tobacco: Never  Vaping Use   Vaping Use: Never used  Substance and Sexual Activity   Alcohol use: Yes    Alcohol/week: 28.0 standard drinks of alcohol    Types: 28 Glasses of wine per week    Comment: daily   Drug use: No   Sexual activity: Not on file  Other Topics Concern   Not on file  Social History Narrative   Patient lives with wife Nettleton.    patient has 2 children.    Patient has his BS   Patient drinks 2-4 glasses wife dialy.    Drinks caffeine daily.          Social Determinants of Health   Financial Resource Strain: Not on file  Food Insecurity: Not on file  Transportation Needs: Not on file  Physical Activity: Not on file  Stress: Not on file  Social Connections: Not on file     Family History: The patient's family history includes Colon cancer (age of onset: 39) in his mother; Diabetes in his father; Heart Problems in his paternal grandfather; Heart disease in his father and paternal uncle. There is no history of Esophageal cancer, Rectal cancer, or Stomach cancer.  ROS:   Please see the history of present illness.    + fatigue/brain fog/drowsiness All other systems reviewed and are negative.  Labs/Other Studies Reviewed:    The following studies were reviewed today:  Cardiac Studies & Procedures     STRESS TESTS  MYOCARDIAL PERFUSION IMAGING 12/09/2019  Narrative  Study was not gated due to frequent PVCs  Fair exercise capacity, achieved 7 METS  Upsloping ST segment depression with exercise. No EKG evidence of ischemia  Defect 1: There is a small defect of moderate severity present in the apical lateral location.  Findings consistent with prior  myocardial infarction.  This is a low risk study.  1. Fixed apical lateral perfusion defect.  Could represent small infarct vs artifact, unable to evaluate wall motion in that region as study was not gated to frequent PVCs.  No ischemia. 2. Low risk study   ECHOCARDIOGRAM  ECHOCARDIOGRAM COMPLETE 12/15/2019  Narrative ECHOCARDIOGRAM REPORT   IMPRESSIONS   1. Left ventricular ejection fraction, by estimation, is 60 to 65%. The left ventricle has normal function. The left ventricle has no regional wall motion abnormalities. There is mild left ventricular hypertrophy. Left ventricular diastolic parameters are consistent with Grade I diastolic dysfunction (impaired relaxation). 2.  Right ventricular systolic function is normal. The right ventricular size is normal. Tricuspid regurgitation signal is inadequate for assessing PA pressure. 3. The mitral valve is normal in structure. No evidence of mitral valve regurgitation. No evidence of mitral stenosis. 4. The aortic valve is tricuspid. Aortic valve regurgitation is not visualized. No aortic stenosis is present. 5. The inferior vena cava is normal in size with greater than 50% respiratory variability, suggesting right atrial pressure of 3 mmHg.  FINDINGS Left Ventricle: Left ventricular ejection fraction, by estimation, is 60 to 65%. The left ventricle has normal function. The left ventricle has no regional wall motion abnormalities. The left ventricular internal cavity size was normal in size. There is mild left ventricular hypertrophy. Left ventricular diastolic parameters are consistent with Grade I diastolic dysfunction (impaired relaxation).  Right Ventricle: The right ventricular size is normal. No increase in right ventricular wall thickness. Right ventricular systolic function is normal. Tricuspid regurgitation signal is inadequate for assessing PA pressure.  Left Atrium: Left atrial size was normal in size.  Right Atrium: Right  atrial size was normal in size.  Pericardium: There is no evidence of pericardial effusion.  Mitral Valve: The mitral valve is normal in structure. There is mild calcification of the mitral valve leaflet(s). Mild mitral annular calcification. No evidence of mitral valve regurgitation. No evidence of mitral valve stenosis.  Tricuspid Valve: The tricuspid valve is normal in structure. Tricuspid valve regurgitation is not demonstrated.  Aortic Valve: The aortic valve is tricuspid. Aortic valve regurgitation is not visualized. No aortic stenosis is present.  Pulmonic Valve: The pulmonic valve was normal in structure. Pulmonic valve regurgitation is not visualized.  Aorta: The aortic root is normal in size and structure.  Venous: The inferior vena cava is normal in size with greater than 50% respiratory variability, suggesting right atrial pressure of 3 mmHg.  IAS/Shunts: No atrial level shunt detected by color flow Doppler.      CT SCANS  CT CARDIAC SCORING (SELF PAY ONLY) 11/04/2019  Narrative CLINICAL DATA:  Elevated cholesterol. Family history of heart disease.  EXAM: CT CARDIAC CORONARY ARTERY CALCIUM SCORE  TECHNIQUE: Non-contrast imaging through the heart was performed using prospective ECG gating. Image post processing was performed on an independent workstation, allowing for quantitative analysis of the heart and coronary arteries. Note that this exam targets the heart and the chest was not imaged in its entirety.  COMPARISON:  Chest radiograph 07/21/2019.  FINDINGS: CORONARY CALCIUM SCORES:  Left Main: 309  LAD: 584  LCx: 50.1  RCA: 1,995  Total Agatston Score: 2,980  MESA database percentile: 99th  AORTA MEASUREMENTS:  Ascending Aorta: 34 mm  Descending Aorta: 23 mm  OTHER FINDINGS:  Cardiovascular: Aortic atherosclerosis. Borderline cardiomegaly, without pericardial effusion.  Mediastinum/Nodes: No imaged thoracic adenopathy.  Lungs/Pleura: No  pleural fluid. Marked right hemidiaphragm elevation. Resultant volume loss in the right lung base.  Upper Abdomen: Normal imaged portions of the liver, spleen, stomach, gallbladder.  Musculoskeletal: Moderate lower thoracic spondylosis.  IMPRESSION: 1. Total Agatston score of 2,980, corresponding to 99th percentile for age, sex, and race based cohort. 2. Marked right hemidiaphragm elevation. 3. Aortic Atherosclerosis (ICD10-I70.0).   Electronically Signed By: Jeronimo Greaves M.D. On: 11/04/2019 16:59           Recent Labs: No results found for requested labs within last 365 days.  Recent Lipid Panel No results found for: "CHOL", "TRIG", "HDL", "CHOLHDL", "VLDL", "LDLCALC", "LDLDIRECT"   Risk Assessment/Calculations:  Physical Exam:    VS:  BP 110/66   Pulse 79   Ht 5\' 7"  (1.702 m)   Wt 171 lb 6.4 oz (77.7 kg)   SpO2 97%   BMI 26.85 kg/m     Wt Readings from Last 3 Encounters:  08/04/22 171 lb 6.4 oz (77.7 kg)  12/31/21 187 lb (84.8 kg)  12/14/20 192 lb 9.6 oz (87.4 kg)     GEN:  Well nourished, well developed in no acute distress HEENT: Normal NECK: No JVD; No carotid bruits CARDIAC: RRR, no murmurs, rubs, gallops RESPIRATORY:  Clear to auscultation without rales, wheezing or rhonchi  ABDOMEN: Soft, non-tender, non-distended MUSCULOSKELETAL:  No edema; No deformity. 2+ pedal pulses, equal bilaterally SKIN: Warm and dry NEUROLOGIC:  Alert and oriented x 3 PSYCHIATRIC:  Normal affect   EKG:  EKG is ordered today.  The ekg ordered today demonstrates normal sinus rhythm at 69 bpm, incomplete RBBB, no ST abnormality       Diagnoses:    1. Coronary artery disease involving native coronary artery of native heart without angina pectoris   2. Hyperlipidemia LDL goal <70   3. Elevated coronary artery calcium score   4. Brain fog    Assessment and Plan:     CAD/elevated coronary artery calcium score: CAC 2980 (99th percentile) with heaviest  distribution of calcium on LAD and RCA on CT calcium score 10/2019. Having concerning symptom of brain fog/fatigue for 2 months. Experiencing symptoms during the office visit, feeling very drowsy. He denies chest pain, dyspnea, palpitations. Has not been exercising since November 2023 so unsure if symptoms worsen with exertion. Lengthy discussion about potential causes. No abnormality on physical exam.  With significantly elevated CAC, I would like to rule out ischemia. With known CAD, need evaluation of symptoms as a potential anginal equivalent to chest pain from myocardial ischemia. He has abnormal EKG with an incomplete RBBB and he cannot walk on a treadmill.  Addendum: I discussed case with Dr. Flora Lipps and he advised that we get cardiac PET rather than coronary CTA. Patient is aware.   Brain fog: Experiencing what he calls drowsiness/brain fog since COVID infection March 2024. Lab tests, brain MR completed by PCP with no acute abnormalities. Referred to cardiology by PCP for further evaluation. As noted above, with history of elevated coronary artery calcium score we will get coronary CTA for mapping of coronary anatomy. Encouraged him to increase physical activity and notify us if symptoms worsen.   Hyperlipidemia LDL goal < 55: LDL 25 on 10/28/2021. He reports it has been well-controlled as long as he has been on Repatha and Lipitor. No changes to current regimen.   Hypertension: BP is well controlled. No symptoms of orthostatic hypotension. Will repeat BMET today. Continue amlodipine, Hyzaar.     Disposition: 2 months with me  Medication Adjustments/Labs and Tests Ordered: Current medicines are reviewed at length with the patient today.  Concerns regarding medicines are outlined above.  Orders Placed This Encounter  Procedures   CT CORONARY MORPH W/CTA COR W/SCORE W/CA W/CM &/OR WO/CM   Basic Metabolic Panel (BMET)   EKG 12-Lead   Meds ordered this encounter  Medications   metoprolol  tartrate (LOPRESSOR) 50 MG tablet    Sig: Take one (1) tablet by mouth ( 50 mg) 2 hours prior to CT scan.    Dispense:  1 tablet    Refill:  0    Patient Instructions  Medication Instructions:   Your physician recommends that  you continue on your current medications as directed. Please refer to the Current Medication list given to you today.   *If you need a refill on your cardiac medications before your next appointment, please call your pharmacy*   Lab Work:  TODAY!!!! BMET  If you have labs (blood work) drawn today and your tests are completely normal, you will receive your results only by: MyChart Message (if you have MyChart) OR A paper copy in the mail If you have any lab test that is abnormal or we need to change your treatment, we will call you to review the results.   Testing/Procedures:    Your cardiac CT will be scheduled at one of the below locations:   [facility] [address] [place], [place] [address] [phone]  If scheduled at [facility], please arrive at the [facility] and Children's Entrance (Entrance C2) of [facility] 30 minutes prior to test start time. You can use the FREE valet parking offered at entrance C (encouraged to control the heart rate for the test)  Proceed to the [facility] Radiology Department (first floor) to check-in and test prep.  All radiology patients and guests should use entrance C2 at [facility], accessed from [facility], even though the hospital's physical address listed is [address].     Please follow these instructions carefully (unless otherwise directed):  Hold all erectile dysfunction medications at least 3 days (72 hrs) prior to test. (Ie viagra, cialis, sildenafil, tadalafil, etc) We will administer nitroglycerin during this exam.   On the Night Before the Test: Be sure to Drink plenty of water. Do not consume any caffeinated/decaffeinated  beverages or chocolate 12 hours prior to your test. Do not take any antihistamines 12 hours prior to your test. On the Day of the Test: Drink plenty of water until 1 hour prior to the test. Do not eat any food 1 hour prior to test. You may take your regular medications prior to the test.  Take metoprolol (Lopressor)one tablet by mouth ( 50 mg)  two hours prior to test. If you take Hydrochlorothiazide (HYZZAR) please HOLD on the morning of the test.       After the Test: Drink plenty of water. After receiving IV contrast, you may experience a mild flushed feeling. This is normal. On occasion, you may experience a mild rash up to 24 hours after the test. This is not dangerous. If this occurs, you can take Benadryl 25 mg and increase your fluid intake. If you experience trouble breathing, this can be serious. If it is severe call 911 IMMEDIATELY. If it is mild, please call our office. We will call to schedule your test 2-4 weeks out understanding that some insurance companies will need an authorization prior to the service being performed.   For non-scheduling related questions, please contact the cardiac imaging nurse navigator should you have any questions/concerns: Rockwell Alexandria, Cardiac Imaging Nurse Navigator Larey Brick, Cardiac Imaging Nurse Navigator [facility] Direct Office Dial: [phone]   For scheduling needs, including cancellations and rescheduling, please call [place], [phone].    Follow-Up: At [facility] you and your health needs are our priority.  As part of our continuing mission to provide you with exceptional heart care, we have created designated Provider Care Teams.  These Care Teams include your primary Cardiologist (physician) and Advanced Practice Providers (APPs -  Physician Assistants and Nurse Practitioners) who all work  together to provide you with the care you need, when you need it.  We recommend signing up for  the patient portal called "MyChart".  Sign up information is provided on this After Visit Summary.  MyChart is used to connect with patients for Virtual Visits (Telemedicine).  Patients are able to view lab/test results, encounter notes, upcoming appointments, etc.  Non-urgent messages can be sent to your provider as well.   To learn more about what you can do with MyChart, go to [email or web address].    Your next appointment:   2 month(s)  Provider:   Eligha Bridegroom, NP           Signed, Levi Aland, NP  08/04/2022 10:54 AM    [facility]

## 2022-08-04 ENCOUNTER — Other Ambulatory Visit: Payer: Self-pay | Admitting: *Deleted

## 2022-08-04 ENCOUNTER — Ambulatory Visit: Payer: Medicare Other | Attending: Nurse Practitioner | Admitting: Nurse Practitioner

## 2022-08-04 ENCOUNTER — Ambulatory Visit: Payer: Medicare Other | Admitting: Neurology

## 2022-08-04 ENCOUNTER — Encounter: Payer: Self-pay | Admitting: Neurology

## 2022-08-04 ENCOUNTER — Encounter: Payer: Self-pay | Admitting: Nurse Practitioner

## 2022-08-04 VITALS — BP 131/65 | HR 91 | Ht 67.0 in | Wt 172.0 lb

## 2022-08-04 VITALS — BP 110/66 | HR 79 | Ht 67.0 in | Wt 171.4 lb

## 2022-08-04 DIAGNOSIS — R202 Paresthesia of skin: Secondary | ICD-10-CM | POA: Diagnosis not present

## 2022-08-04 DIAGNOSIS — I2510 Atherosclerotic heart disease of native coronary artery without angina pectoris: Secondary | ICD-10-CM

## 2022-08-04 DIAGNOSIS — E785 Hyperlipidemia, unspecified: Secondary | ICD-10-CM

## 2022-08-04 DIAGNOSIS — R931 Abnormal findings on diagnostic imaging of heart and coronary circulation: Secondary | ICD-10-CM | POA: Diagnosis not present

## 2022-08-04 DIAGNOSIS — R292 Abnormal reflex: Secondary | ICD-10-CM

## 2022-08-04 DIAGNOSIS — G918 Other hydrocephalus: Secondary | ICD-10-CM | POA: Diagnosis not present

## 2022-08-04 DIAGNOSIS — R4189 Other symptoms and signs involving cognitive functions and awareness: Secondary | ICD-10-CM

## 2022-08-04 MED ORDER — METOPROLOL TARTRATE 50 MG PO TABS: ORAL_TABLET | ORAL | 0 refills | Status: DC

## 2022-08-04 NOTE — Addendum Note (Signed)
Addended by: Levi Aland on: 08/04/2022 03:21 PM   Modules accepted: Orders

## 2022-08-04 NOTE — Patient Instructions (Addendum)
Medication Instructions:   Your physician recommends that you continue on your current medications as directed. Please refer to the Current Medication list given to you today.   *If you need a refill on your cardiac medications before your next appointment, please call your pharmacy*   Lab Work:  TODAY!!!! BMET  If you have labs (blood work) drawn today and your tests are completely normal, you will receive your results only by: MyChart Message (if you have MyChart) OR A paper copy in the mail If you have any lab test that is abnormal or we need to change your treatment, we will call you to review the results.   Testing/Procedures:    Your cardiac CT will be scheduled at one of the below locations:   [facility] [address] [place], [place] [address] [phone]  If scheduled at [facility], please arrive at the [facility] and Children's Entrance (Entrance C2) of [facility] 30 minutes prior to test start time. You can use the FREE valet parking offered at entrance C (encouraged to control the heart rate for the test)  Proceed to the [facility] Radiology Department (first floor) to check-in and test prep.  All radiology patients and guests should use entrance C2 at [facility], accessed from [facility], even though the hospital's physical address listed is [address].     Please follow these instructions carefully (unless otherwise directed):  Hold all erectile dysfunction medications at least 3 days (72 hrs) prior to test. (Ie viagra, cialis, sildenafil, tadalafil, etc) We will administer nitroglycerin during this exam.   On the Night Before the Test: Be sure to Drink plenty of water. Do not consume any caffeinated/decaffeinated beverages or chocolate 12 hours prior to your test. Do not take any antihistamines 12 hours prior to your test. On the Day of the Test: Drink plenty of water until 1 hour prior to  the test. Do not eat any food 1 hour prior to test. You may take your regular medications prior to the test.  Take metoprolol (Lopressor)one tablet by mouth ( 50 mg)  two hours prior to test. If you take Hydrochlorothiazide (HYZZAR) please HOLD on the morning of the test.       After the Test: Drink plenty of water. After receiving IV contrast, you may experience a mild flushed feeling. This is normal. On occasion, you may experience a mild rash up to 24 hours after the test. This is not dangerous. If this occurs, you can take Benadryl 25 mg and increase your fluid intake. If you experience trouble breathing, this can be serious. If it is severe call 911 IMMEDIATELY. If it is mild, please call our office. We will call to schedule your test 2-4 weeks out understanding that some insurance companies will need an authorization prior to the service being performed.   For non-scheduling related questions, please contact the cardiac imaging nurse navigator should you have any questions/concerns: Rockwell Alexandria, Cardiac Imaging Nurse Navigator Larey Brick, Cardiac Imaging Nurse Navigator [facility] Direct Office Dial: [phone]   For scheduling needs, including cancellations and rescheduling, please call [place], [phone].    Follow-Up: At [facility], you and your health needs are our priority.  As part of our continuing mission to provide you with exceptional heart care, we have created designated Provider Care Teams.  These Care Teams include your primary Cardiologist (physician) and Advanced Practice Providers (APPs -  Physician Assistants and Nurse Practitioners) who all work together to provide you with the care you need, when you need it.  We recommend signing up for the patient portal called "MyChart".  Sign up information is provided on this After Visit Summary.  MyChart is used to connect with patients for Virtual Visits (Telemedicine).   Patients are able to view lab/test results, encounter notes, upcoming appointments, etc.  Non-urgent messages can be sent to your provider as well.   To learn more about what you can do with MyChart, go to [email or web address].    Your next appointment:   2 month(s)  Provider:   Eligha Bridegroom, NP

## 2022-08-04 NOTE — Progress Notes (Signed)
[facility] Neurology Division Clinic Note - Initial Visit   Date: 08/04/2022   Randy Moreno MRN: [id number] DOB: 1945-07-08   Dear Dr. Waynard Edwards:  Thank you for your kind referral of Randy Moreno for consultation of neuropathy and abnormal MRI brain. Although his history is well known to you, please allow [place] to reiterate it for the purpose of our medical record. The patient was accompanied to the clinic by self. who also provides collateral information.     Randy Moreno is a 77 y.o. right-handed male with hypertension and hyperlipidemia presenting for evaluation of neuropathy and abnormal MRI brain.   IMPRESSION/PLAN: Bilateral leg paresthesias with exam showing hyperreflexia in the legs is concerning for lumbar canal stenosis.  Distal sensation and strength is intact making neuropathy less likely.  However, I did counsel patient to try to reduce alcohol consumption to prevent neurotoxic effects.   - MRI lumbar spine wo contrast  - Going forward, consider MRI cervical spine   2.  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  Imagining was personally viewed with patient.  He denies imbalance, incontinence, or cognitive impairment.   Further recommendations pending results.  ------------------------------------------------------------- History of present illness: He reports having tingling/numbness involving the lower legs and feet about 10 years ago, at which time he saw Dr. Hosie Poisson at [facility] and has normal NCS/EMG.  Symptoms resolved without intervention and returned in late 2024.  Symptoms are constant.  No alleviating factors.  Balance is good, but he is aware of some imbalance.  He walks unassisted and no falls.  He tried gabapentin which did not help his symptoms. He endorses low back pain which is worse with prolonged walking.    He also had headaches about a month ago, which has since subsided.  Part of his evaluation included MRI brain.   Findings shows parenchymal volume loss and small vessel ischemic changes.  He is referred for evaluation of NPH. He denies imbalance, incontinence, or cognitive impairment.   He is not diabetic.  He drink 2-3 glasses of wine daily.  He lives with wife in a one-level home.    Out-side paper records, electronic medical record, and images have been reviewed where available and summarized as:  MRI brain wwo contrast 07/04/2022: 1. No acute intracranial pathology or specific finding to explain the patient's headaches. 2. Parenchymal volume loss and chronic small-vessel ischemic change.   NCS/EMG of the legs 01/21/2013: Nerve conduction studies done on both lower extremities were unremarkable, without evidence of a peripheral neuropathy. A small fiber neuropathy may be missed by standard nerve conduction studies, however. Clinical correlation is required. EMG evaluation of both lower extremities was unremarkable, no evidence of a lumbosacral radiculopathy was seen on either side.    Past Medical History:  Diagnosis Date   Allergy    Arthritis    neck   Cataract    Gout    Hyperlipidemia    Hypertension     Past Surgical History:  Procedure Laterality Date   APPENDECTOMY  1986   CATARACT EXTRACTION     COLONOSCOPY     POLYPECTOMY     ROTATOR CUFF REPAIR Right    march 4    TOTAL SHOULDER ARTHROPLASTY Right 07/28/2019   Procedure: REVERSE TOTAL SHOULDER ARTHROPLASTY;  Surgeon: Jones Broom, MD;  Location: [facility];  Service: Orthopedics;  Laterality: Right;     Medications:  Outpatient Encounter Medications as of 08/04/2022  Medication Sig   amLODipine (  NORVASC) 5 MG tablet Take 5 mg by mouth daily.   ANDROGEL PUMP 20.25 MG/ACT (1.62%) GEL Apply 1 application topically daily.    atorvastatin (LIPITOR) 20 MG tablet Take 20 mg by mouth daily.   cholecalciferol (VITAMIN D) 1000 UNITS tablet Take 1,000 Units by mouth daily.   Cyanocobalamin (VITAMIN B 12 PO) Take 1 tablet by mouth  daily.    EPINEPHrine 0.3 mg/0.3 mL IJ SOAJ injection use as directed for severe allergic reaction   escitalopram (LEXAPRO) 10 MG tablet Take 10 mg by mouth daily.   fish oil-omega-3 fatty acids 1000 MG capsule Take 2 g by mouth daily.   losartan-hydrochlorothiazide (HYZAAR) 100-12.5 MG per tablet Take 1 tablet by mouth daily.   metoprolol tartrate (LOPRESSOR) 50 MG tablet Take one (1) tablet by mouth ( 50 mg) 2 hours prior to CT scan.   Multiple Vitamin (MULTIVITAMIN) tablet Take 1 tablet by mouth daily.   REPATHA SURECLICK 140 MG/ML SOAJ Inject into the skin every 14 (fourteen) days.   saw palmetto 160 MG capsule Take 160 mg by mouth daily.   sildenafil (VIAGRA) 100 MG tablet 1 tablet as needed   No facility-administered encounter medications on file as of 08/04/2022.    Allergies: No Known Allergies  Family History: Family History  Problem Relation Age of Onset   Colon cancer Mother 78   Heart disease Father    Diabetes Father    Heart Problems Paternal Grandfather    Heart disease Paternal Uncle    Esophageal cancer Neg Hx    Rectal cancer Neg Hx    Stomach cancer Neg Hx     Social History: Social History   Tobacco Use   Smoking status: Former    Years: 20    Types: Cigarettes    Quit date: 07/22/1987    Years since quitting: 35.0   Smokeless tobacco: Never  Vaping Use   Vaping Use: Never used  Substance Use Topics   Alcohol use: Yes    Alcohol/week: 28.0 standard drinks of alcohol    Types: 28 Glasses of wine per week    Comment: daily   Drug use: No   Social History   Social History Narrative   Patient lives with wife [place]on. patient has 2 children. Patient has his BSPatient drinks 2-4 glasses wife dialy. Drinks caffeine daily.       Right Handed    Lives in a one story home. Has steps to the attic. Lives with Wife.    Vital Signs:  BP 131/65   Pulse 91   Ht 5\' 7"  (1.702 m)   Wt 172 lb (78 kg)   SpO2 97%   BMI 26.94 kg/m   Neurological Exam: MENTAL  STATUS including orientation to time, place, person, recent and remote memory, attention span and concentration, language, and fund of knowledge is normal.  Speech is not dysarthric.  CRANIAL NERVES: II:  No visual field defects.     III-IV-VI: Pupils equal round and reactive to light.  Normal conjugate, extra-ocular eye movements in all directions of gaze.  No nystagmus.  No ptosis.   V:  Normal facial sensation.    VII:  Normal facial symmetry and movements.   VIII:  Normal hearing and vestibular function.   IX-X:  Normal palatal movement.   XI:  Normal shoulder shrug and head rotation.   XII:  Normal tongue strength and range of motion, no deviation or fasciculation.  MOTOR:  No atrophy, fasciculations or abnormal movements.  No pronator drift.   Upper Extremity:  Right  Left  Deltoid  5/5   5/5   Biceps  5/5   5/5   Triceps  5/5   5/5   Wrist extensors  5/5   5/5   Wrist flexors  5/5   5/5   Finger extensors  5/5   5/5   Finger flexors  5/5   5/5   Dorsal interossei  5/5   5/5   Abductor pollicis  5/5   5/5   Tone (Ashworth scale)  0  0   Lower Extremity:  Right  Left  Hip flexors  5/5   5/5   Hip extensors  5/5   5/5   Adductor 5/5  5/5  Abductor 5/5  5/5  Knee flexors  5/5   5/5   Knee extensors  5/5   5/5   Dorsiflexors  5/5   5/5   Plantarflexors  5/5   5/5   Toe extensors  5/5   5/5   Toe flexors  5/5   5/5   Tone (Ashworth scale)  0  0   MSRs:                                           Right        Left brachioradialis 2+  2+  biceps 3+  3+  triceps 2+  2+  patellar 3+  3+  ankle jerk 2+  2+  Hoffman no  no  plantar response down  down   SENSORY:  Normal and symmetric perception of light touch, pinprick, vibration, and proprioception.  Romberg's sign absent.   COORDINATION/GAIT: Normal finger-to- nose-finger.  Intact rapid alternating movements bilaterally. Gait mildly wide-based and stable, unassisted. Stressed gait intact.  Unable to perform tandem  gait.   Thank you for allowing me to participate in patient's care.  If I can answer any additional questions, I would be pleased to do so.    Sincerely,    Juddson Cobern K. Allena Katz, DO

## 2022-08-05 ENCOUNTER — Telehealth: Payer: Self-pay | Admitting: Nurse Practitioner

## 2022-08-05 LAB — BASIC METABOLIC PANEL
BUN/Creatinine Ratio: 20 (ref 10–24)
BUN: 20 mg/dL (ref 8–27)
CO2: 25 mmol/L (ref 20–29)
Calcium: 9.6 mg/dL (ref 8.6–10.2)
Chloride: 99 mmol/L (ref 96–106)
Creatinine, Ser: 0.98 mg/dL (ref 0.76–1.27)
Glucose: 96 mg/dL (ref 70–99)
Potassium: 4.2 mmol/L (ref 3.5–5.2)
Sodium: 141 mmol/L (ref 134–144)
eGFR: 81 mL/min/{1.73_m2} (ref >59)

## 2022-08-05 NOTE — Telephone Encounter (Signed)
Returned call to patient. He is concerned about stress test ordered for him, he states he has neuropathy and is not able to walk on a treadmill.  Explained to patient he will not need to walk on treadmill for cardiac PET CT scan. This will just be a scan, and he will be given a stress agent via IV.  Patient verbalized understanding and expressed appreciation for call.

## 2022-08-05 NOTE — Telephone Encounter (Signed)
New Message:    Patient says he would like to talk to [facility] about his upcoming Stress. If she is not able to call, please have her nurse  to call him.

## 2022-08-07 ENCOUNTER — Telehealth: Payer: Self-pay | Admitting: Neurology

## 2022-08-07 NOTE — Telephone Encounter (Signed)
Pt called in wanting to find out which medication Dr. Allena Katz wanted him to stop taking for his MRI tomorrow. He forgot which one she said.

## 2022-08-07 NOTE — Telephone Encounter (Signed)
He can take medications as prescribed, from my standpoint, nothing needs to be stopped.

## 2022-08-07 NOTE — Telephone Encounter (Signed)
Called patient and informed patient of Dr. Eliane Decree recommendation. Patient verbalized understanding and thanked [place] for the call.

## 2022-08-08 ENCOUNTER — Ambulatory Visit
Admission: RE | Admit: 2022-08-08 | Discharge: 2022-08-08 | Disposition: A | Discharge status: Home / Self Care | Payer: Medicare Other | Source: Ambulatory Visit | Attending: Neurology | Admitting: Neurology

## 2022-08-08 DIAGNOSIS — R202 Paresthesia of skin: Secondary | ICD-10-CM

## 2022-08-08 DIAGNOSIS — R292 Abnormal reflex: Secondary | ICD-10-CM

## 2022-08-18 ENCOUNTER — Encounter: Payer: Self-pay | Admitting: Cardiovascular Disease

## 2022-08-18 NOTE — Telephone Encounter (Signed)
Error

## 2022-08-20 ENCOUNTER — Other Ambulatory Visit: Payer: Self-pay

## 2022-08-20 DIAGNOSIS — R292 Abnormal reflex: Secondary | ICD-10-CM

## 2022-08-20 DIAGNOSIS — R202 Paresthesia of skin: Secondary | ICD-10-CM

## 2022-10-05 NOTE — Progress Notes (Deleted)
Cardiology Office Note:    Date:  10/05/2022   ID:  Randy Moreno, DOB March 28, 1945, MRN [id number]  PCP:  Rodrigo Ran, MD   [facility] Providers Cardiologist:  Reatha Harps, MD      Referring MD: Rodrigo Ran, MD   Chief Complaint: fatigue  History of Present Illness:    Randy Moreno is a very pleasant 77 y.o. male with a hx of CAD with elevated CAC score 2980, normal nuclear stress test 11/2019, HLD, HTN, and former tobacco abuse.   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 Underwent CT calcium score 10/2019 which revealed CAC 2980 (99th percentile), aortic atherosclerosis and was referred to cardiology.  He was seen by Dr. Flora Lipps on 12/02/2019.  He was on Lipitor 40 mg daily with most recent LDL cholesterol 64.  History of heart disease in his father.  He had negative nuclear stress test 11/2019. AAA screening for former smoker was negative.   Last cardiology clinic visit was 12/31/21 with Dr. Flora Lipps. He reported he was walking every day 1 mile with no limitations. No changes were made to treatment regimen and 1 year follow-up was recommended.  Seen in clinic by me on 08/04/22 for evaluation of symptoms of fatigue, lightheadedness since March 2024. Referred by PCP for further evaluation. Had shoulder surgery April 2024, went well. Had COVID infection in March and since that time feels "drowsy, and like he has brain fog." BP has been well-controlled, no evidence of hypotension or increased light headedness with position changes. Has not been walking for exercise since November 2023, limited by hip pain and weather. Admits to no other exertional activities. Weight loss of 17 lb, intentional with dietary changes, portion control.  He denied chest pain, shortness of breath, edema, orthopnea, PND, presyncope, syncope. No symptoms walking in today but currently feeling "drowsy, not quite thinking normally." Feels like symptoms occur more often when he is at rest. Is sleeping well, no  concerns. Extensive work-up with PCP, lab results, MR of brain without significant abnormality. He stays well hydrated. Coronary calcium score of 2980 in 2021. NM Cardiac PET CT was ordered for evaluation of ischemia.   Today,   Past Medical History:  Diagnosis Date   Allergy    Arthritis    neck   Cataract    Gout    Hyperlipidemia    Hypertension     Past Surgical History:  Procedure Laterality Date   APPENDECTOMY  1986   CATARACT EXTRACTION     COLONOSCOPY     POLYPECTOMY     ROTATOR CUFF REPAIR Right    march 4    TOTAL SHOULDER ARTHROPLASTY Right 07/28/2019   Procedure: REVERSE TOTAL SHOULDER ARTHROPLASTY;  Surgeon: Jones Broom, MD;  Location: [facility];  Service: Orthopedics;  Laterality: Right;    Current Medications: No outpatient medications have been marked as taking for the 10/13/22 encounter (Appointment) with Lissa Hoard, Zachary George, NP.     Allergies:   Patient has no known allergies.   Social History   Socioeconomic History   Marital status: Married    Spouse name: Alice    Number of children: 2   Years of education: BS   Highest education level: Not on file  Occupational History   Occupation: Retired   Occupation: Retired Field seismologist    Comment: [employer]  Tobacco Use   Smoking status: Former    Current packs/day: 0.00    Types: Cigarettes  Start date: 07/22/1967    Quit date: 07/22/1987    Years since quitting: 35.2   Smokeless tobacco: Never  Vaping Use   Vaping status: Never Used  Substance and Sexual Activity   Alcohol use: Yes    Alcohol/week: 28.0 standard drinks of alcohol    Types: 28 Glasses of wine per week    Comment: daily   Drug use: No   Sexual activity: Not on file  Other Topics Concern   Not on file  Social History Narrative   Patient lives with wife [place]. patient has 2 children. Patient has his BSPatient drinks 2-4 glasses wife dialy. Drinks caffeine daily.       Right Handed    Lives in a one story  home. Has steps to the attic. Lives with Wife.   Social Determinants of Health   Financial Resource Strain: Not on file  Food Insecurity: Not on file  Transportation Needs: Not on file  Physical Activity: Not on file  Stress: Not on file  Social Connections: Not on file     Family History: The patient's family history includes Colon cancer (age of onset: 66) in his mother; Diabetes in his father; Heart Problems in his paternal grandfather; Heart disease in his father and paternal uncle. There is no history of Esophageal cancer, Rectal cancer, or Stomach cancer.  ROS:   Please see the history of present illness.    + fatigue/brain fog/drowsiness *** All other systems reviewed and are negative.  Labs/Other Studies Reviewed:    The following studies were reviewed today:  Cardiac Studies & Procedures     STRESS TESTS  MYOCARDIAL PERFUSION IMAGING 12/09/2019  Narrative  Study was not gated due to frequent PVCs  Fair exercise capacity, achieved 7 METS  Upsloping ST segment depression with exercise. No EKG evidence of ischemia  Defect 1: There is a small defect of moderate severity present in the apical lateral location.  Findings consistent with prior myocardial infarction.  This is a low risk study.  1. Fixed apical lateral perfusion defect.  Could represent small infarct vs artifact, unable to evaluate wall motion in that region as study was not gated to frequent PVCs.  No ischemia. 2. Low risk study   ECHOCARDIOGRAM  ECHOCARDIOGRAM COMPLETE 12/15/2019  Narrative ECHOCARDIOGRAM REPORT   IMPRESSIONS   1. Left ventricular ejection fraction, by estimation, is 60 to 65%. The left ventricle has normal function. The left ventricle has no regional wall motion abnormalities. There is mild left ventricular hypertrophy. Left ventricular diastolic parameters are consistent with Grade I diastolic dysfunction (impaired relaxation). 2. Right ventricular systolic function is  normal. The right ventricular size is normal. Tricuspid regurgitation signal is inadequate for assessing PA pressure. 3. The mitral valve is normal in structure. No evidence of mitral valve regurgitation. No evidence of mitral stenosis. 4. The aortic valve is tricuspid. Aortic valve regurgitation is not visualized. No aortic stenosis is present. 5. The inferior vena cava is normal in size with greater than 50% respiratory variability, suggesting right atrial pressure of 3 mmHg.  FINDINGS Left Ventricle: Left ventricular ejection fraction, by estimation, is 60 to 65%. The left ventricle has normal function. The left ventricle has no regional wall motion abnormalities. The left ventricular internal cavity size was normal in size. There is mild left ventricular hypertrophy. Left ventricular diastolic parameters are consistent with Grade I diastolic dysfunction (impaired relaxation).  Right Ventricle: The right ventricular size is normal. No increase in right ventricular wall thickness. Right  ventricular systolic function is normal. Tricuspid regurgitation signal is inadequate for assessing PA pressure.  Left Atrium: Left atrial size was normal in size.  Right Atrium: Right atrial size was normal in size.  Pericardium: There is no evidence of pericardial effusion.  Mitral Valve: The mitral valve is normal in structure. There is mild calcification of the mitral valve leaflet(s). Mild mitral annular calcification. No evidence of mitral valve regurgitation. No evidence of mitral valve stenosis.  Tricuspid Valve: The tricuspid valve is normal in structure. Tricuspid valve regurgitation is not demonstrated.  Aortic Valve: The aortic valve is tricuspid. Aortic valve regurgitation is not visualized. No aortic stenosis is present.  Pulmonic Valve: The pulmonic valve was normal in structure. Pulmonic valve regurgitation is not visualized.  Aorta: The aortic root is normal in size and  structure.  Venous: The inferior vena cava is normal in size with greater than 50% respiratory variability, suggesting right atrial pressure of 3 mmHg.  IAS/Shunts: No atrial level shunt detected by color flow Doppler.      CT SCANS  CT CARDIAC SCORING (SELF PAY ONLY) 11/04/2019  Narrative CLINICAL DATA:  Elevated cholesterol. Family history of heart disease.  EXAM: CT CARDIAC CORONARY ARTERY CALCIUM SCORE  TECHNIQUE: Non-contrast imaging through the heart was performed using prospective ECG gating. Image post processing was performed on an independent workstation, allowing for quantitative analysis of the heart and coronary arteries. Note that this exam targets the heart and the chest was not imaged in its entirety.  COMPARISON:  Chest radiograph 07/21/2019.  FINDINGS: CORONARY CALCIUM SCORES:  Left Main: 309  LAD: 584  LCx: 50.1  RCA: 1,995  Total Agatston Score: 2,980  MESA database percentile: 99th  AORTA MEASUREMENTS:  Ascending Aorta: 34 mm  Descending Aorta: 23 mm  OTHER FINDINGS:  Cardiovascular: Aortic atherosclerosis. Borderline cardiomegaly, without pericardial effusion.  Mediastinum/Nodes: No imaged thoracic adenopathy.  Lungs/Pleura: No pleural fluid. Marked right hemidiaphragm elevation. Resultant volume loss in the right lung base.  Upper Abdomen: Normal imaged portions of the liver, spleen, stomach, gallbladder.  Musculoskeletal: Moderate lower thoracic spondylosis.  IMPRESSION: 1. Total Agatston score of 2,980, corresponding to 99th percentile for age, sex, and race based cohort. 2. Marked right hemidiaphragm elevation. 3. Aortic Atherosclerosis (ICD10-I70.0).   Electronically Signed By: Jeronimo Greaves M.D. On: 11/04/2019 16:59           Recent Labs: 08/04/2022: BUN 20; Creatinine, Ser 0.98; Potassium 4.2; Sodium 141  Recent Lipid Panel No results found for: "CHOL", "TRIG", "HDL", "CHOLHDL", "VLDL", "LDLCALC",  "LDLDIRECT"   Risk Assessment/Calculations:           Physical Exam:    VS:  There were no vitals taken for this visit.    Wt Readings from Last 3 Encounters:  08/04/22 172 lb (78 kg)  08/04/22 171 lb 6.4 oz (77.7 kg)  12/31/21 187 lb (84.8 kg)     GEN:  Well nourished, well developed in no acute distress HEENT: Normal NECK: No JVD; No carotid bruits CARDIAC: RRR, no murmurs, rubs, gallops RESPIRATORY:  Clear to auscultation without rales, wheezing or rhonchi  ABDOMEN: Soft, non-tender, non-distended MUSCULOSKELETAL:  No edema; No deformity. 2+ pedal pulses, equal bilaterally SKIN: Warm and dry NEUROLOGIC:  Alert and oriented x 3 PSYCHIATRIC:  Normal affect   EKG:  EKG is ***  No BP recorded.  {Refresh Note OR Click here to enter BP  :1}***    Diagnoses:    No diagnosis found.  Assessment and Plan:  CAD/elevated coronary artery calcium score: CAC 2980 (99th percentile) with heaviest distribution of calcium on LAD and RCA on CT calcium score 10/2019. Having concerning symptom of brain fog/fatigue for 2 months. Experiencing symptoms during the office visit, feeling very drowsy. He denies chest pain, dyspnea, palpitations. Has not been exercising since November 2023 so unsure if symptoms worsen with exertion. Lengthy discussion about potential causes. No abnormality on physical exam.  With significantly elevated CAC, I would like to rule out ischemia. Will get coronary CTA for mapping of coronary anatomy Addendum: I discussed case with Dr. Flora Lipps and he advised that we get cardiac PET rather than coronary CTA. Patient is aware.   Brain fog: Experiencing what he calls drowsiness/brain fog since COVID infection March 2024. Lab tests, brain MR completed by PCP with no acute abnormalities. Referred to cardiology by PCP for further evaluation. As noted above, with history of elevated coronary artery calcium score we will get coronary CTA for mapping of coronary anatomy.  Encouraged him to increase physical activity and notify us if symptoms worsen.   Hyperlipidemia LDL goal < 55: LDL 25 on 10/28/2021. He reports it has been well-controlled as long as he has been on Repatha and Lipitor. No changes to current regimen.   Hypertension: BP is well controlled. No symptoms of orthostatic hypotension. Will repeat BMET today. Continue amlodipine, Hyzaar.     Disposition: ***  Medication Adjustments/Labs and Tests Ordered: Current medicines are reviewed at length with the patient today.  Concerns regarding medicines are outlined above.  No orders of the defined types were placed in this encounter.  No orders of the defined types were placed in this encounter.   There are no Patient Instructions on file for this visit.   Signed, Levi Aland, NP  10/05/2022 1:23 PM     [facility]

## 2022-10-13 ENCOUNTER — Ambulatory Visit: Payer: Medicare Other | Admitting: Nurse Practitioner

## 2022-10-15 ENCOUNTER — Encounter: Payer: Self-pay | Admitting: Internal Medicine

## 2022-10-15 ENCOUNTER — Ambulatory Visit (AMBULATORY_SURGERY_CENTER): Payer: Medicare Other

## 2022-10-15 VITALS — Ht 67.0 in | Wt 179.0 lb

## 2022-10-15 DIAGNOSIS — Z86010 Personal history of colonic polyps: Secondary | ICD-10-CM

## 2022-10-15 MED ORDER — NA SULFATE-K SULFATE-MG SULF 17.5-3.13-1.6 GM/177ML PO SOLN: 1.0000 | Freq: Once | ORAL | 0 refills | Status: AC

## 2022-10-15 NOTE — Progress Notes (Signed)
No egg or soy allergy known to patient  No issues known to pt with past sedation with any surgeries or procedures Patient denies ever being told they had issues or difficulty with intubation  No FH of Malignant Hyperthermia Pt is not on diet pills Pt is not on  home 02  Pt is not on blood thinners  Pt denies issues with constipation  No A fib or A flutter Have any cardiac testing pending--no Pt can ambulate indepedently Pt denies use of chewing tobacco Discussed diabetic I weight loss medication holds Discussed NSAID holds Checked BMI Pt instructed to use Singlecare.com or GoodRx for a price reduction on prep  Patient's chart reviewed by Cathlyn Parsons CNRA prior to previsit and patient appropriate for the [facility].  Pre visit completed and red dot placed by patient's name on their procedure day (on provider's schedule).

## 2022-10-16 ENCOUNTER — Telehealth: Payer: Self-pay | Admitting: Internal Medicine

## 2022-10-16 NOTE — Telephone Encounter (Signed)
Inbound cal from patient stating his weight is incorrect. Patient has weight as 170 and states it needs to be 179. Please advise.

## 2022-10-16 NOTE — Telephone Encounter (Signed)
Called and spoke with patient about his weight in My chart and updated it to 179lbs.

## 2022-10-20 ENCOUNTER — Telehealth: Payer: Self-pay | Admitting: Cardiovascular Disease

## 2022-10-20 NOTE — Telephone Encounter (Signed)
Patient requested a call back. He declined discussing with me and prefers to speak directly with clinical staff if possible.

## 2022-10-20 NOTE — Telephone Encounter (Signed)
I tried to help patient. He declined wanted to talk to [address] where Randy Moreno is.

## 2022-10-20 NOTE — Telephone Encounter (Signed)
Returned pts call. Pt would like to speak directly to [employer]. Told patient I would forward that over to her but it most likely would not be today that she would be able to call back. Pt understood.

## 2022-10-21 NOTE — Telephone Encounter (Signed)
Returned call to patient who states he received letter from [employer] that PET CT has been denied. He is going to find the letter and call back to report the specific reason for denial.

## 2022-10-22 ENCOUNTER — Telehealth: Payer: Self-pay | Admitting: Nurse Practitioner

## 2022-10-22 NOTE — Telephone Encounter (Signed)
Patient is following up regarding PA approval. He is requesting to speak directly with Eligha Bridegroom, NP, regarding this matter if possible.

## 2022-10-22 NOTE — Telephone Encounter (Signed)
Randy Moreno dropped off a letter from [employer] for your review.  I am placing this letter in your box in the chart room.  Thank you.

## 2022-10-22 NOTE — Telephone Encounter (Signed)
Fayrene Fearing with [employer] Department is calling to inform the office the PA for the PET scan has been approved as of today.

## 2022-10-27 NOTE — Telephone Encounter (Signed)
-----   Message from [facility]  G sent at 10/22/2022 12:43 PM EDT ----- Pet got denied Marcelino Duster said keep in box till figure out next test. ----- Message ----- From: Levi Aland, NP Sent: 10/10/2022  10:51 AM EDT To: Aldean Baker,   He was scheduled to see me Monday 7/29 but has not had PET CT. I called him, he is doing fine so I cancelled Monday's appt and told him you would watch to see when PET gets ordered and schedule him to come back in after test.  Thanks! Marcelino Duster

## 2022-10-27 NOTE — Telephone Encounter (Signed)
S/w pt due to approval of PET test. Pt knew the test was approved and is scheduled for 8/28. Scheduled f/u appt to see MSW on 9/10.

## 2022-11-03 ENCOUNTER — Encounter: Payer: Medicare Other | Admitting: Internal Medicine

## 2022-11-05 ENCOUNTER — Encounter: Payer: Self-pay | Admitting: Internal Medicine

## 2022-11-05 ENCOUNTER — Other Ambulatory Visit (HOSPITAL_COMMUNITY): Payer: Medicare Other

## 2022-11-05 ENCOUNTER — Ambulatory Visit (AMBULATORY_SURGERY_CENTER): Payer: Medicare Other | Admitting: Internal Medicine

## 2022-11-05 VITALS — BP 113/58 | HR 67 | Temp 97.8°F | Resp 16 | Ht 67.0 in | Wt 170.0 lb

## 2022-11-05 DIAGNOSIS — Z09 Encounter for follow-up examination after completed treatment for conditions other than malignant neoplasm: Secondary | ICD-10-CM

## 2022-11-05 DIAGNOSIS — Z860100 Personal history of colon polyps, unspecified: Secondary | ICD-10-CM

## 2022-11-05 MED ORDER — SODIUM CHLORIDE 0.9 % IV SOLN: 500.0000 mL | Freq: Once | INTRAVENOUS | Status: DC

## 2022-11-05 NOTE — Op Note (Signed)
[facility] Patient Name: [name] Procedure Date: 11/05/2022 9:43 AM MRN: [id number] Endoscopist: Wilhemina Bonito. Marina Goodell , MD, [id number] Age: 77 Referring MD:  Date of Birth: 05-29-1945 Gender: Male Account #: [id number] Procedure:                Colonoscopy Indications:              High risk colon cancer surveillance: Personal                            history of non-advanced adenoma, High risk colon                            cancer surveillance: Personal history of sessile                            serrated colon polyp (less than 10 mm in size) with                            no dysplasia. Mother with colon cancer in her early                            5s. Previous examinations 2002, 2005, 2008, 2014,                            2019 Medicines:                Monitored Anesthesia Care Procedure:                Pre-Anesthesia Assessment:                           - Prior to the procedure, a History and Physical                            was performed, and patient medications and                            allergies were reviewed. The patient's tolerance of                            previous anesthesia was also reviewed. The risks                            and benefits of the procedure and the sedation                            options and risks were discussed with the patient.                            All questions were answered, and informed consent                            was obtained. Prior Anticoagulants: The patient has  taken no anticoagulant or antiplatelet agents. ASA                            Grade Assessment: II - A patient with mild systemic                            disease. After reviewing the risks and benefits,                            the patient was deemed in satisfactory condition to                            undergo the procedure.                           After obtaining informed consent, the colonoscope                             was passed under direct vision. Throughout the                            procedure, the patient's blood pressure, pulse, and                            oxygen saturations were monitored continuously. The                            Olympus Scope [id number] was introduced through the                            anus and advanced to the the cecum, identified by                            appendiceal orifice and ileocecal valve. The                            ileocecal valve, appendiceal orifice, and rectum                            were photographed. The quality of the bowel                            preparation was excellent. The colonoscopy was                            performed without difficulty. The patient tolerated                            the procedure well. The bowel preparation used was                            SUPREP via split dose instruction. Scope In: 9:55:59 AM Scope Out: 10:06:28 AM Scope Withdrawal Time: 0 hours 7 minutes 56 seconds  Total Procedure Duration:  0 hours 10 minutes 29 seconds  Findings:                 Multiple diverticula were found in the left colon                            and right colon.                           Internal hemorrhoids were found during                            retroflexion. The hemorrhoids were moderate.                           The exam was otherwise without abnormality on                            direct and retroflexion views. Complications:            No immediate complications. Estimated blood loss:                            None. Estimated Blood Loss:     Estimated blood loss: none. Impression:               - Diverticulosis in the left colon and in the right                            colon.                           - Internal hemorrhoids.                           - The examination was otherwise normal on direct                            and retroflexion views.                           - No  specimens collected. Recommendation:           - Repeat colonoscopy is not recommended for                            surveillance.                           - Patient has a contact number available for                            emergencies. The signs and symptoms of potential                            delayed complications were discussed with the                            patient. Return to  normal activities tomorrow.                            Written discharge instructions were provided to the                            patient.                           - Resume previous diet.                           - Continue present medications. Wilhemina Bonito. Marina Goodell, MD 11/05/2022 10:10:53 AM This report has been signed electronically.

## 2022-11-05 NOTE — Progress Notes (Signed)
HISTORY OF PRESENT ILLNESS:  Randy Moreno is a 77 y.o. male with a history of sessile serrated and adenomatous colon polyps.  Now for surveillance colonoscopy  REVIEW OF SYSTEMS:  All non-GI ROS negative except for  Past Medical History:  Diagnosis Date   Allergy    Arthritis    neck   Cataract    Gout    Hyperlipidemia    Hypertension     Past Surgical History:  Procedure Laterality Date   APPENDECTOMY  1986   CATARACT EXTRACTION     COLONOSCOPY     POLYPECTOMY     ROTATOR CUFF REPAIR Right    march 4    SHOULDER SURGERY Right 2024   TOTAL SHOULDER ARTHROPLASTY Right 07/28/2019   Procedure: REVERSE TOTAL SHOULDER ARTHROPLASTY;  Surgeon: Jones Broom, MD;  Location: [facility];  Service: Orthopedics;  Laterality: Right;    Social History Randy Moreno  reports that he quit smoking about 35 years ago. His smoking use included cigarettes. He started smoking about 55 years ago. He has never used smokeless tobacco. He reports current alcohol use of about 28.0 standard drinks of alcohol per week. He reports that he does not use drugs.  family history includes Colon cancer (age of onset: 53) in his mother; Diabetes in his father; Heart Problems in his paternal grandfather; Heart disease in his father and paternal uncle.  No Known Allergies     PHYSICAL EXAMINATION: Vital signs: BP (!) 129/59   Pulse 69   Temp 97.8 F (36.6 C)   Resp 16   Ht 5\' 7"  (1.702 m)   Wt 170 lb (77.1 kg)   SpO2 99%   BMI 26.63 kg/m  General: Well-developed, well-nourished, no acute distress HEENT: Sclerae are anicteric, conjunctiva pink. Oral mucosa intact Lungs: Clear Heart: Regular Abdomen: soft, nontender, nondistended, no obvious ascites, no peritoneal signs, normal bowel sounds. No organomegaly. Extremities: No edema Psychiatric: alert and oriented x3. Cooperative     ASSESSMENT:  History of adenomatous and sessile serrated polyps   PLAN:  Surveillance colonoscopy

## 2022-11-05 NOTE — Patient Instructions (Signed)
Handouts Provided:  Diverticulosis  YOU HAD AN ENDOSCOPIC PROCEDURE TODAY AT THE [facility]:   Refer to the procedure report that was given to you for any specific questions about what was found during the examination.  If the procedure report does not answer your questions, please call your gastroenterologist to clarify.  If you requested that your care partner not be given the details of your procedure findings, then the procedure report has been included in a sealed envelope for you to review at your convenience later.  YOU SHOULD EXPECT: Some feelings of bloating in the abdomen. Passage of more gas than usual.  Walking can help get rid of the air that was put into your GI tract during the procedure and reduce the bloating. If you had a lower endoscopy (such as a colonoscopy or flexible sigmoidoscopy) you may notice spotting of blood in your stool or on the toilet paper. If you underwent a bowel prep for your procedure, you may not have a normal bowel movement for a few days.  Please Note:  You might notice some irritation and congestion in your nose or some drainage.  This is from the oxygen used during your procedure.  There is no need for concern and it should clear up in a day or so.  SYMPTOMS TO REPORT IMMEDIATELY:  Following lower endoscopy (colonoscopy or flexible sigmoidoscopy):  Excessive amounts of blood in the stool  Significant tenderness or worsening of abdominal pains  Swelling of the abdomen that is new, acute  Fever of 100F or higher  For urgent or emergent issues, a gastroenterologist can be reached at any hour by calling [phone]. Do not use MyChart messaging for urgent concerns.    DIET:  We do recommend a small meal at first, but then you may proceed to your regular diet.  Drink plenty of fluids but you should avoid alcoholic beverages for 24 hours.  ACTIVITY:  You should plan to take it easy for the rest of today and you should NOT DRIVE or use heavy  machinery until tomorrow (because of the sedation medicines used during the test).    FOLLOW UP: Our staff will call the number listed on your records the next business day following your procedure.  We will call around 7:15- 8:00 am to check on you and address any questions or concerns that you may have regarding the information given to you following your procedure. If we do not reach you, we will leave a message.     If any biopsies were taken you will be contacted by phone or by letter within the next 1-3 weeks.  Please call us at [phone] if you have not heard about the biopsies in 3 weeks.    SIGNATURES/CONFIDENTIALITY: You and/or your care partner have signed paperwork which will be entered into your electronic medical record.  These signatures attest to the fact that that the information above on your After Visit Summary has been reviewed and is understood.  Full responsibility of the confidentiality of this discharge information lies with you and/or your care-partner.  

## 2022-11-05 NOTE — Progress Notes (Signed)
Pt's states no medical or surgical changes since previsit or office visit. 

## 2022-11-05 NOTE — Progress Notes (Signed)
Report to PACU, RN, vss, BBS= Clear.  

## 2022-11-06 ENCOUNTER — Telehealth: Payer: Self-pay

## 2022-11-06 NOTE — Telephone Encounter (Signed)
  Follow up Call-     11/05/2022    9:12 AM  Call back number  Post procedure Call Back phone  # [phone]  Permission to leave phone message Yes     Patient questions:  Do you have a fever, pain , or abdominal swelling? No. Pain Score  0 *  Have you tolerated food without any problems? Yes.    Have you been able to return to your normal activities? Yes.    Do you have any questions about your discharge instructions: Diet   No. Medications  No. Follow up visit  No.  Do you have questions or concerns about your Care? No.  Actions: * If pain score is 4 or above: No action needed, pain <4.

## 2022-11-10 ENCOUNTER — Encounter (HOSPITAL_COMMUNITY): Payer: Self-pay

## 2022-11-11 ENCOUNTER — Telehealth (HOSPITAL_COMMUNITY): Payer: Self-pay | Admitting: *Deleted

## 2022-11-11 NOTE — Telephone Encounter (Signed)
 Received call from patient regarding upcoming cardiac imaging study; pt verbalizes understanding of appt date/time, parking situation and where to check in, pre-test NPO status and medications ordered, and verified current allergies; name and call back number provided for further questions should they arise Johney Frame RN Navigator Cardiac Imaging [facility] [phone] office [phone] cell

## 2022-11-11 NOTE — Telephone Encounter (Signed)
Attempted to call patient regarding upcoming cardiac PET appointment. Left message on voicemail with name and callback number  Merle Prescott RN Navigator Cardiac Imaging [facility] [phone] Office [phone] Cell  

## 2022-11-12 ENCOUNTER — Encounter (HOSPITAL_COMMUNITY)
Admission: RE | Admit: 2022-11-12 | Discharge: 2022-11-12 | Disposition: A | Discharge status: Home / Self Care | Payer: Medicare Other | Source: Ambulatory Visit | Attending: Nurse Practitioner | Admitting: Nurse Practitioner

## 2022-11-12 DIAGNOSIS — E785 Hyperlipidemia, unspecified: Secondary | ICD-10-CM

## 2022-11-12 DIAGNOSIS — I2510 Atherosclerotic heart disease of native coronary artery without angina pectoris: Secondary | ICD-10-CM | POA: Diagnosis not present

## 2022-11-12 DIAGNOSIS — R931 Abnormal findings on diagnostic imaging of heart and coronary circulation: Secondary | ICD-10-CM | POA: Diagnosis not present

## 2022-11-12 LAB — NM PET CT CARDIAC PERFUSION MULTI W/ABSOLUTE BLOODFLOW
LV dias vol: 90 mL (ref 62–150)
LV sys vol: 24 mL
MBFR: 1.97
Nuc Rest EF: 67 %
Nuc Stress EF: 73 %
Peak HR: 78 {beats}/min
Rest HR: 70 {beats}/min
Rest MBF: 1.01 ml/g/min
Rest Nuclear Isotope Dose: 20 mCi
ST Depression (mm): 0 mm
Stress MBF: 1.99 ml/g/min
Stress Nuclear Isotope Dose: 20 mCi
TID: 0.96

## 2022-11-12 MED ORDER — REGADENOSON 0.4 MG/5ML IV SOLN
0.4000 mg | Freq: Once | INTRAVENOUS | Status: AC
  Administered 2022-11-12: 0.4 mg via INTRAVENOUS

## 2022-11-12 MED ORDER — RUBIDIUM RB82 GENERATOR (RUBYFILL)
20.0000 | PACK | Freq: Once | INTRAVENOUS | Status: AC
  Administered 2022-11-12: 20 via INTRAVENOUS

## 2022-11-12 MED ORDER — REGADENOSON 0.4 MG/5ML IV SOLN
INTRAVENOUS | Status: AC
  Filled 2022-11-12: qty 5

## 2022-11-12 NOTE — Progress Notes (Signed)
Patient presents for a cardiac PET stress test and tolerated procedure without incident. Patient maintained acceptable vital signs throughout the test and was offered caffeine after test.  Patient ambulated out of department with a steady gait.  

## 2022-11-19 NOTE — Progress Notes (Signed)
Cardiology Office Note:    Date:  11/25/2022   ID:  Randy Moreno, DOB 03-25-1945, MRN [id number]  PCP:  Rodrigo Ran, MD   [facility] Providers Cardiologist:  Reatha Harps, MD      Referring MD: Rodrigo Ran, MD   Chief Complaint: follow-up CAD  History of Present Illness:    Randy Moreno is a very pleasant 77 y.o. male with a hx of CAD with elevated CAC score 2980, normal nuclear stress test 11/2019, HLD, HTN, and former tobacco abuse.   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 Underwent CT calcium score 10/2019 which revealed CAC 2980 (99th percentile), aortic atherosclerosis and was referred to cardiology.  He was seen by Dr. Flora Lipps on 12/02/2019.  He was on Lipitor 40 mg daily with most recent LDL cholesterol 64.  History of heart disease in his father.  He had negative nuclear stress test 11/2019. AAA screening for former smoker was negative.   Last cardiology clinic visit was 12/31/21 with Dr. Flora Lipps. He reported he was walking every day 1 mile with no limitations. No changes were made to treatment regimen and 1 year follow-up was recommended.  Seen by me on 08/04/22 for evaluation of symptoms of fatigue, lightheadedness since March 2024. Referred by PCP for further evaluation. Had shoulder surgery April 2024, went well. Had COVID infection in March and since that time feels "drowsy, and like he has brain fog." BP has been well-controlled, no evidence of hypotension or increased light headedness with position changes. Has not been walking for exercise since November 2023, limited by hip pain and weather. Admits to no other exertional activities. Weight loss of 17 lb, intentional with dietary changes, portion control. Denied chest pain, shortness of breath, edema, orthopnea, PND, presyncope, syncope. No symptoms walking in today but currently feeling "drowsy, not quite thinking normally." Symptoms occur more often when he is at rest. Is sleeping well, no concerns. Extensive  work-up with PCP, lab results, MR of brain without significant abnormality. He stays well hydrated. Cardiac PET CT was completed 11/12/22 and was low risk with no evidence of infarction or ischemia.  Today, he is here alone for follow-up and reports that he is feeling well. He denies further episodes of fatigue or brain fog. He is active, but is not exercising on a consistent basis like he did in the past. 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 Admits he needs to resume exercise. Recently had 3rd right shoulder surgery, with little improvement. He reports 18 lb intentional weight loss with dietary improvements over the past year which has stabilized. Eats a healthy diet, avoids high sodium processed foods. He denies chest pain, shortness of breath, lower extremity edema, fatigue, palpitations, melena, presyncope, syncope, orthopnea, and PND.   Past Medical History:  Diagnosis Date   Allergy    Arthritis    neck   Cataract    Gout    Hyperlipidemia    Hypertension     Past Surgical History:  Procedure Laterality Date   APPENDECTOMY  1986   CATARACT EXTRACTION     COLONOSCOPY     POLYPECTOMY     ROTATOR CUFF REPAIR Right    march 4    SHOULDER SURGERY Right 2024   TOTAL SHOULDER ARTHROPLASTY Right 07/28/2019   Procedure: REVERSE TOTAL SHOULDER ARTHROPLASTY;  Surgeon: Jones Broom, MD;  Location: [facility];  Service: Orthopedics;  Laterality: Right;    Current Medications: Current Meds  Medication Sig  amLODipine (NORVASC) 5 MG tablet Take 5 mg by mouth daily.   ANDROGEL PUMP 20.25 MG/ACT (1.62%) GEL Apply 1 application topically daily.    atorvastatin (LIPITOR) 20 MG tablet Take 20 mg by mouth daily.   cholecalciferol (VITAMIN D) 1000 UNITS tablet Take 1,000 Units by mouth daily.   Cyanocobalamin (VITAMIN B 12 PO) Take 1 tablet by mouth daily.    EPINEPHrine 0.3 mg/0.3 mL IJ SOAJ injection    escitalopram (LEXAPRO) 10 MG tablet Take 10 mg by mouth daily.    fluticasone (CUTIVATE) 0.05 % cream Apply topically 2 (two) times daily. to affected area   losartan-hydrochlorothiazide (HYZAAR) 100-12.5 MG per tablet Take 1 tablet by mouth daily.   Multiple Vitamin (MULTIVITAMIN) tablet Take 1 tablet by mouth daily.   REPATHA SURECLICK 140 MG/ML SOAJ Inject into the skin every 14 (fourteen) days.   saw palmetto 160 MG capsule Take 160 mg by mouth daily.   sildenafil (VIAGRA) 100 MG tablet 1 tablet as needed     Allergies:   Patient has no known allergies.   Social History   Socioeconomic History   Marital status: Married    Spouse name: Alice    Number of children: 2   Years of education: BS   Highest education level: Not on file  Occupational History   Occupation: Retired   Occupation: Retired Field seismologist    Comment: [employer]  Tobacco Use   Smoking status: Former    Current packs/day: 0.00    Types: Cigarettes    Start date: 07/22/1967    Quit date: 07/22/1987    Years since quitting: 35.3   Smokeless tobacco: Never  Vaping Use   Vaping status: Never Used  Substance and Sexual Activity   Alcohol use: Yes    Alcohol/week: 28.0 standard drinks of alcohol    Types: 28 Glasses of wine per week    Comment: daily   Drug use: No   Sexual activity: Not on file  Other Topics Concern   Not on file  Social History Narrative   Patient lives with wife [place]. patient has 2 children. Patient has his BSPatient drinks 2-4 glasses wife dialy. Drinks caffeine daily.       Right Handed    Lives in a one story home. Has steps to the attic. Lives with Wife.   Social Determinants of Health   Financial Resource Strain: Not on file  Food Insecurity: Not on file  Transportation Needs: Not on file  Physical Activity: Not on file  Stress: Not on file  Social Connections: Not on file     Family History: The patient's family history includes Colon cancer (age of onset: 44) in his mother; Diabetes in his father; Heart Problems in  his paternal grandfather; Heart disease in his father and paternal uncle. There is no history of Esophageal cancer, Rectal cancer, Stomach cancer, or Colon polyps.  ROS:   Please see the history of present illness.   All other systems reviewed and are negative.  Labs/Other Studies Reviewed:    The following studies were reviewed today:  Cardiac Studies & Procedures     STRESS TESTS  NM PET CT 11/12/22 The study is normal. The study is low risk.   LV perfusion is normal. There is no evidence of ischemia. There is no evidence of infarction.   Rest left ventricular function is normal. Rest EF: 67%. Stress left ventricular function is normal. Stress EF: 73%. End diastolic cavity size  is normal. End systolic cavity size is normal. No evidence of transient ischemic dilation (TID) noted.   Myocardial blood flow was computed to be 1.83ml/g/min at rest and 1.57ml/g/min at stress. Global myocardial blood flow reserve was 1.97 and was borderline normal.   Coronary calcium was present on the attenuation correction CT images. Severe coronary calcifications were present. Coronary calcifications were present in the left anterior descending artery, left circumflex artery and right coronary artery distribution(s).   MYOCARDIAL PERFUSION IMAGING 12/09/2019  Narrative  Study was not gated due to frequent PVCs  Fair exercise capacity, achieved 7 METS  Upsloping ST segment depression with exercise. No EKG evidence of ischemia  Defect 1: There is a small defect of moderate severity present in the apical lateral location.  Findings consistent with prior myocardial infarction.  This is a low risk study.  1. Fixed apical lateral perfusion defect.  Could represent small infarct vs artifact, unable to evaluate wall motion in that region as study was not gated to frequent PVCs.  No ischemia. 2. Low risk study   ECHOCARDIOGRAM  ECHOCARDIOGRAM COMPLETE 12/15/2019  Narrative ECHOCARDIOGRAM  REPORT   IMPRESSIONS   1. Left ventricular ejection fraction, by estimation, is 60 to 65%. The left ventricle has normal function. The left ventricle has no regional wall motion abnormalities. There is mild left ventricular hypertrophy. Left ventricular diastolic parameters are consistent with Grade I diastolic dysfunction (impaired relaxation). 2. Right ventricular systolic function is normal. The right ventricular size is normal. Tricuspid regurgitation signal is inadequate for assessing PA pressure. 3. The mitral valve is normal in structure. No evidence of mitral valve regurgitation. No evidence of mitral stenosis. 4. The aortic valve is tricuspid. Aortic valve regurgitation is not visualized. No aortic stenosis is present. 5. The inferior vena cava is normal in size with greater than 50% respiratory variability, suggesting right atrial pressure of 3 mmHg.  FINDINGS Left Ventricle: Left ventricular ejection fraction, by estimation, is 60 to 65%. The left ventricle has normal function. The left ventricle has no regional wall motion abnormalities. The left ventricular internal cavity size was normal in size. There is mild left ventricular hypertrophy. Left ventricular diastolic parameters are consistent with Grade I diastolic dysfunction (impaired relaxation).  Right Ventricle: The right ventricular size is normal. No increase in right ventricular wall thickness. Right ventricular systolic function is normal. Tricuspid regurgitation signal is inadequate for assessing PA pressure.  Left Atrium: Left atrial size was normal in size.  Right Atrium: Right atrial size was normal in size.  Pericardium: There is no evidence of pericardial effusion.  Mitral Valve: The mitral valve is normal in structure. There is mild calcification of the mitral valve leaflet(s). Mild mitral annular calcification. No evidence of mitral valve regurgitation. No evidence of mitral valve stenosis.  Tricuspid Valve:  The tricuspid valve is normal in structure. Tricuspid valve regurgitation is not demonstrated.  Aortic Valve: The aortic valve is tricuspid. Aortic valve regurgitation is not visualized. No aortic stenosis is present.  Pulmonic Valve: The pulmonic valve was normal in structure. Pulmonic valve regurgitation is not visualized.  Aorta: The aortic root is normal in size and structure.  Venous: The inferior vena cava is normal in size with greater than 50% respiratory variability, suggesting right atrial pressure of 3 mmHg.  IAS/Shunts: No atrial level shunt detected by color flow Doppler.      CT SCANS  CT CARDIAC SCORING (SELF PAY ONLY) 11/04/2019  Narrative CLINICAL DATA:  Elevated cholesterol. Family history of heart  disease.  EXAM: CT CARDIAC CORONARY ARTERY CALCIUM SCORE  TECHNIQUE: Non-contrast imaging through the heart was performed using prospective ECG gating. Image post processing was performed on an independent workstation, allowing for quantitative analysis of the heart and coronary arteries. Note that this exam targets the heart and the chest was not imaged in its entirety.  COMPARISON:  Chest radiograph 07/21/2019.  FINDINGS: CORONARY CALCIUM SCORES:  Left Main: 309  LAD: 584  LCx: 50.1  RCA: 1,995  Total Agatston Score: 2,980  MESA database percentile: 99th  AORTA MEASUREMENTS:  Ascending Aorta: 34 mm  Descending Aorta: 23 mm  IMPRESSION: 1. Total Agatston score of 2,980, corresponding to 99th percentile for age, sex, and race based cohort. 2. Marked right hemidiaphragm elevation. 3. Aortic Atherosclerosis (ICD10-I70.0).   Electronically Signed By: Jeronimo Greaves M.D. On: 11/04/2019 16:59           Recent Labs: 08/04/2022: BUN 20; Creatinine, Ser 0.98; Potassium 4.2; Sodium 141  Recent Lipid Panel No results found for: "CHOL", "TRIG", "HDL", "CHOLHDL", "VLDL", "LDLCALC", "LDLDIRECT"   Risk Assessment/Calculations:         Physical  Exam:    VS:  BP 102/60   Pulse 62   Ht 5\' 7"  (1.702 m)   Wt 172 lb 9.6 oz (78.3 kg)   SpO2 95%   BMI 27.03 kg/m     Wt Readings from Last 3 Encounters:  11/25/22 172 lb 9.6 oz (78.3 kg)  11/24/22 173 lb (78.5 kg)  11/05/22 170 lb (77.1 kg)     GEN:  Well nourished, well developed in no acute distress HEENT: Normal NECK: No JVD; No carotid bruits CARDIAC: RRR, no murmurs, rubs, gallops RESPIRATORY:  Clear to auscultation without rales, wheezing or rhonchi  ABDOMEN: Soft, non-tender, non-distended MUSCULOSKELETAL:  No edema; No deformity. 2+ pedal pulses, equal bilaterally SKIN: Warm and dry NEUROLOGIC:  Alert and oriented x 3 PSYCHIATRIC:  Normal affect   EKG:  EKG is not ordered today     Diagnoses:    1. Coronary artery disease involving native coronary artery of native heart without angina pectoris   2. Elevated coronary artery calcium score   3. Brain fog   4. Hyperlipidemia LDL goal <70   5. Essential hypertension   6. Incomplete right bundle branch block     Assessment and Plan:     CAD/elevated coronary artery calcium score: CAC 2980 (99th percentile) with heaviest distribution of calcium on LAD and RCA on CT calcium score 10/2019. At last office visit, he reported concerning symptoms of brain fog/fatigue for 2 months. We ordered cardiac PET which revealed normal LV perfusion, no evidence of ischemia or infarction, low risk study. Significant coronary calcifications were present. Today, he reports he is feeling well with no concerning cardiac symptoms. I have encouraged him to gradually increase exercise to achieve 150 minutes of moderate intensity exercise each week. Continue heart healthy diet, good BP control, and good cholesterol control.   Brain fog: Improved. No further concerns.   Incomplete right bundle branch block: Seen on EKG 08/04/22. No evidence of ischemia and normal LVEF on cardiac PET 11/12/22. No indication for further testing at this time.    Hyperlipidemia LDL goal < 70: LDL 27 on 11/04/22. Reports it has been well-controlled as long as he has been on Repatha and Lipitor. Continue.   Hypertension: BP is well controlled, somewhat soft. Home BP readings are well controlled. No medication changes today.     Disposition: 6 months with  Dr. Flora Lipps or APP  Medication Adjustments/Labs and Tests Ordered: Current medicines are reviewed at length with the patient today.  Concerns regarding medicines are outlined above.  No orders of the defined types were placed in this encounter.  No orders of the defined types were placed in this encounter.   Patient Instructions  Medication Instructions:   Your physician recommends that you continue on your current medications as directed. Please refer to the Current Medication list given to you today.   *If you need a refill on your cardiac medications before your next appointment, please call your pharmacy*   Lab Work:  None ordered.  If you have labs (blood work) drawn today and your tests are completely normal, you will receive your results only by: MyChart Message (if you have MyChart) OR A paper copy in the mail If you have any lab test that is abnormal or we need to change your treatment, we will call you to review the results.   Testing/Procedures:  None ordered.   Follow-Up: At [facility], you and your health needs are our priority.  As part of our continuing mission to provide you with exceptional heart care, we have created designated Provider Care Teams.  These Care Teams include your primary Cardiologist (physician) and Advanced Practice Providers (APPs -  Physician Assistants and Nurse Practitioners) who all work together to provide you with the care you need, when you need it.  We recommend signing up for the patient portal called "MyChart".  Sign up information is provided on this After Visit Summary.  MyChart is used to connect with patients for Virtual  Visits (Telemedicine).  Patients are able to view lab/test results, encounter notes, upcoming appointments, etc.  Non-urgent messages can be sent to your provider as well.   To learn more about what you can do with MyChart, go to [email or web address].    Your next appointment:   6 month(s)  Provider:   Reatha Harps, MD  or Lebron Conners   Other Instructions  Your physician wants you to follow-up in: 6 months.  You will receive a reminder letter in the mail two months in advance. If you don't receive a letter, please call our office to schedule the follow-up appointment.     Signed, Levi Aland, NP  11/25/2022 12:12 PM    [facility]

## 2022-11-24 ENCOUNTER — Ambulatory Visit: Payer: Medicare Other | Admitting: Neurology

## 2022-11-24 ENCOUNTER — Encounter: Payer: Self-pay | Admitting: Neurology

## 2022-11-24 VITALS — BP 120/61 | HR 65 | Ht 67.0 in | Wt 173.0 lb

## 2022-11-24 DIAGNOSIS — R202 Paresthesia of skin: Secondary | ICD-10-CM

## 2022-11-24 DIAGNOSIS — R292 Abnormal reflex: Secondary | ICD-10-CM | POA: Diagnosis not present

## 2022-11-24 DIAGNOSIS — G918 Other hydrocephalus: Secondary | ICD-10-CM | POA: Diagnosis not present

## 2022-11-24 NOTE — Progress Notes (Signed)
Follow-up Visit   Date: 11/24/2022    Randy Moreno MRN: [id number] DOB: 17-Nov-1945    Randy Moreno is a 77 y.o. right-handed Caucasian male with hypertension and hyperlipidemia returning to the clinic for follow-up of bilateral leg paresthesias.  The patient was accompanied to the clinic by self.  IMPRESSION/PLAN: Bilateral leg paresthesias from multilevel lumbosacral degenerative changes. Exam shows hyperreflexia and he does not moderate spinal canal stenosis at L3-4.  Prior NCS/EMG of the legs was normal and exam does not suggest neuropathy.  He completed PT and continues to be compliant with home exercises. Hydrocephalus ex vacuo.  He denies imbalance, incontinence, or cognitive impairment making NPH unlikely.    Return to clinic as needed  --------------------------------------------- History of present illness: He reports having tingling/numbness involving the lower legs and feet about 10 years ago, at which time he saw Dr. Hosie Poisson at [facility] and has normal NCS/EMG.  Symptoms resolved without intervention and returned in late 2024.  Symptoms are constant.  No alleviating factors.  Balance is good, but he is aware of some imbalance.  He walks unassisted and no falls.  He tried gabapentin which did not help his symptoms. He endorses low back pain which is worse with prolonged walking.     He also had headaches about a month ago, which has since subsided.  Part of his evaluation included MRI brain.  Findings shows parenchymal volume loss and small vessel ischemic changes.  He is referred for evaluation of NPH. He denies imbalance, incontinence, or cognitive impairment.    He is not diabetic.  He drink 2-3 glasses of wine daily.  He lives with wife in a one-level home.    UPDATE 11/24/2022:  He is here for follow-up visit.  At his last visit, exam showed brisk reflexes which was concerning for lumbar pathology.  MRI lumbar spine shows multilevel degenerative changes with foraminal stenosis  and moderate canal stenosis at L3-4.  He was referred for PT which has helped.  He continues to have numbness/tingling in the legs, but reports that it has been present for years and not bothersome. He denies imbalance, falls, or memory changes.    Medications:  Current Outpatient Medications on File Prior to Visit  Medication Sig Dispense Refill   amLODipine (NORVASC) 5 MG tablet Take 5 mg by mouth daily.     ANDROGEL PUMP 20.25 MG/ACT (1.62%) GEL Apply 1 application topically daily.      atorvastatin (LIPITOR) 20 MG tablet Take 20 mg by mouth daily.     cholecalciferol (VITAMIN D) 1000 UNITS tablet Take 1,000 Units by mouth daily.     Cyanocobalamin (VITAMIN B 12 PO) Take 1 tablet by mouth daily.      EPINEPHrine 0.3 mg/0.3 mL IJ SOAJ injection use as directed for severe allergic reaction (Patient not taking: Reported on 10/15/2022)     escitalopram (LEXAPRO) 10 MG tablet Take 10 mg by mouth daily.     fish oil-omega-3 fatty acids 1000 MG capsule Take 2 g by mouth daily.     fluticasone (CUTIVATE) 0.05 % cream Apply topically 2 (two) times daily. to affected area     losartan-hydrochlorothiazide (HYZAAR) 100-12.5 MG per tablet Take 1 tablet by mouth daily.     metoprolol tartrate (LOPRESSOR) 50 MG tablet Take one (1) tablet by mouth ( 50 mg) 2 hours prior to CT scan. (Patient not taking: Reported on 10/15/2022) 1 tablet 0   Multiple Vitamin (MULTIVITAMIN) tablet Take 1 tablet by  mouth daily.     REPATHA SURECLICK 140 MG/ML SOAJ Inject into the skin every 14 (fourteen) days.     saw palmetto 160 MG capsule Take 160 mg by mouth daily.     sildenafil (VIAGRA) 100 MG tablet 1 tablet as needed     No current facility-administered medications on file prior to visit.    Allergies: No Known Allergies  Vital Signs:  There were no vitals taken for this visit.    Neurological Exam: MENTAL STATUS including orientation to time, place, person, recent and remote memory, attention span and  concentration, language, and fund of knowledge is normal.  Speech is not dysarthric.  CRANIAL NERVES:  No visual field defects.  Pupils equal round and reactive to light.  Normal conjugate, extra-ocular eye movements in all directions of gaze.  No ptosis.  Face is symmetric. Palate elevates symmetrically.  Tongue is midline.  MOTOR:  Motor strength is 5/5 in all extremities.  No atrophy, fasciculations or abnormal movements.  No pronator drift.  Tone is normal.    MSRs:  Reflexes are 2+/4 throughout, except 3+/4 at the patella.  SENSORY:  Intact to vibration throughout.  COORDINATION/GAIT:  Normal finger-to- nose-finger.  Intact rapid alternating movements bilaterally.  Gait narrow based and stable. Unassisted.  Data: MRI brain wwo contrast 07/04/2022: 1. No acute intracranial pathology or specific finding to explain the patient's headaches. 2. Parenchymal volume loss and chronic small-vessel ischemic change.   NCS/EMG of the legs 01/21/2013: Nerve conduction studies done on both lower extremities were unremarkable, without evidence of a peripheral neuropathy. A small fiber neuropathy may be missed by standard nerve conduction studies, however. Clinical correlation is required. EMG evaluation of both lower extremities was unremarkable, no evidence of a lumbosacral radiculopathy was seen on either side.  MRI lumbar spine 08/08/2022: 1. L3-L4 moderate spinal canal stenosis and mild bilateral neural foraminal narrowing. 2. L2-L3 mild-to-moderate spinal canal stenosis, moderate right neural foraminal narrowing, and mild left neural foraminal narrowing. 3. L4-L5 mild-to-moderate bilateral neural foraminal narrowing. 4. L1-L2 and L5-S1 mild bilateral neural foraminal narrowing.   Thank you for allowing me to participate in patient's care.  If I can answer any additional questions, I would be pleased to do so.    Sincerely,    Royale Lennartz K. Allena Katz, DO

## 2022-11-24 NOTE — Patient Instructions (Signed)
It was great to see you today!  If your symptoms get worse, please come back and see me.

## 2022-11-25 ENCOUNTER — Encounter: Payer: Self-pay | Admitting: Nurse Practitioner

## 2022-11-25 ENCOUNTER — Ambulatory Visit: Payer: Medicare Other | Attending: Nurse Practitioner | Admitting: Nurse Practitioner

## 2022-11-25 VITALS — BP 102/60 | HR 62 | Ht 67.0 in | Wt 172.6 lb

## 2022-11-25 DIAGNOSIS — I4510 Unspecified right bundle-branch block: Secondary | ICD-10-CM

## 2022-11-25 DIAGNOSIS — R4189 Other symptoms and signs involving cognitive functions and awareness: Secondary | ICD-10-CM

## 2022-11-25 DIAGNOSIS — E785 Hyperlipidemia, unspecified: Secondary | ICD-10-CM

## 2022-11-25 DIAGNOSIS — I10 Essential (primary) hypertension: Secondary | ICD-10-CM

## 2022-11-25 DIAGNOSIS — I2510 Atherosclerotic heart disease of native coronary artery without angina pectoris: Secondary | ICD-10-CM | POA: Diagnosis not present

## 2022-11-25 DIAGNOSIS — R931 Abnormal findings on diagnostic imaging of heart and coronary circulation: Secondary | ICD-10-CM

## 2022-11-25 NOTE — Patient Instructions (Signed)
Medication Instructions:   Your physician recommends that you continue on your current medications as directed. Please refer to the Current Medication list given to you today.   *If you need a refill on your cardiac medications before your next appointment, please call your pharmacy*   Lab Work:  None ordered.  If you have labs (blood work) drawn today and your tests are completely normal, you will receive your results only by: MyChart Message (if you have MyChart) OR A paper copy in the mail If you have any lab test that is abnormal or we need to change your treatment, we will call you to review the results.   Testing/Procedures:  None ordered.   Follow-Up: At [facility], you and your health needs are our priority.  As part of our continuing mission to provide you with exceptional heart care, we have created designated Provider Care Teams.  These Care Teams include your primary Cardiologist (physician) and Advanced Practice Providers (APPs -  Physician Assistants and Nurse Practitioners) who all work together to provide you with the care you need, when you need it.  We recommend signing up for the patient portal called "MyChart".  Sign up information is provided on this After Visit Summary.  MyChart is used to connect with patients for Virtual Visits (Telemedicine).  Patients are able to view lab/test results, encounter notes, upcoming appointments, etc.  Non-urgent messages can be sent to your provider as well.   To learn more about what you can do with MyChart, go to [email or web address].    Your next appointment:   6 month(s)  Provider:   Reatha Harps, MD  or Lebron Conners   Other Instructions  Your physician wants you to follow-up in: 6 months.  You will receive a reminder letter in the mail two months in advance. If you don't receive a letter, please call our office to schedule the follow-up appointment.

## 2023-05-18 ENCOUNTER — Encounter: Payer: Self-pay | Admitting: Nurse Practitioner

## 2023-05-28 ENCOUNTER — Ambulatory Visit: Payer: Medicare Other | Admitting: Nurse Practitioner

## 2023-06-15 ENCOUNTER — Ambulatory Visit: Admitting: Nurse Practitioner

## 2023-10-15 ENCOUNTER — Ambulatory Visit (HOSPITAL_BASED_OUTPATIENT_CLINIC_OR_DEPARTMENT_OTHER): Admitting: Nurse Practitioner

## 2023-10-15 VITALS — BP 134/64 | HR 66 | Ht 68.0 in | Wt 167.7 lb

## 2023-10-15 DIAGNOSIS — I10 Essential (primary) hypertension: Secondary | ICD-10-CM

## 2023-10-15 DIAGNOSIS — R931 Abnormal findings on diagnostic imaging of heart and coronary circulation: Secondary | ICD-10-CM

## 2023-10-15 DIAGNOSIS — E785 Hyperlipidemia, unspecified: Secondary | ICD-10-CM | POA: Diagnosis not present

## 2023-10-15 DIAGNOSIS — I2510 Atherosclerotic heart disease of native coronary artery without angina pectoris: Secondary | ICD-10-CM

## 2023-10-15 DIAGNOSIS — I4510 Unspecified right bundle-branch block: Secondary | ICD-10-CM

## 2023-10-15 NOTE — Progress Notes (Signed)
 [id number]  Cardiology Office Note:    Date:  10/16/2023   ID:  Randy Moreno, DOB 1945-05-05, MRN [id number]  PCP:  Shayne Anes, MD   [facility] Providers Cardiologist:  Darryle ONEIDA Decent, MD      Referring MD: Shayne Anes, MD   Chief Complaint: follow-up CAD  History of Present Illness:    Randy Moreno is a very pleasant 78 y.o. male with a hx of CAD with elevated CAC score 2980, normal nuclear stress test 11/2019, HLD, HTN, and former tobacco abuse.   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 Underwent CT calcium score 10/2019 which revealed CAC 2980 (99th percentile), aortic atherosclerosis and was referred to cardiology.  Seen by Dr. Decent on 12/02/2019. Was on Lipitor 40 mg daily with most recent LDL cholesterol 64.  History of heart disease in his father. Negative nuclear stress test 11/2019. AAA screening for former smoker was negative.   Seen 12/31/21 by Dr. Decent. Was walking every day 1 mile with no limitations. No changes were made to treatment regimen and 1 year follow-up was recommended.  Seen by me on 08/04/22 for symptoms of fatigue, lightheadedness since March 2024. Referred by PCP for further evaluation. Had shoulder surgery April 2024, went well. Had COVID infection in March and since that time feels drowsy, and like he has brain fog. BP has been well-controlled, no evidence of hypotension or increased light headedness with position changes. Has not been walking for exercise since November 2023, limited by hip pain and weather. Admits to no other exertional activities. Weight loss of 17 lb, intentional with dietary changes, portion control. Denied chest pain, shortness of breath, edema, orthopnea, PND, presyncope, syncope. No symptoms walking in today but currently feeling drowsy, not quite thinking normally. Symptoms occur more often when he is at rest. Is sleeping well, no concerns. Extensive work-up with PCP, lab  results, MR of brain without significant abnormality. He stays well hydrated. Cardiac PET CT was completed 11/12/22 and was low risk with no evidence of infarction or ischemia.  Seen in follow-up 11/25/2022, feeling well. No further episodes of fatigue or brain fog. He is active, but is not exercising on a consistent basis like he did in the past. 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 Admits he needs to resume exercise. Recently had 3rd right shoulder surgery, with little improvement. 18 lb intentional weight loss with dietary improvements over the past year which has stabilized. Eats a healthy diet, avoids high sodium processed foods. No concerning cardiac symptoms. LDL was 27 on 10/2022, BP well controlled.   Clemens a few months ago and bruised some ribs  Discussed the use of AI scribe software for clinical note transcription with the patient, who gave verbal consent to proceed.  History of Present Illness Randy Moreno is a 78 year old male who presents for follow-up of coronary calcification. He monitors his blood pressure at home, which is usually in the low 120s over 60s. He has lost 34 pounds over the past two years, with a recent gain of four to five pounds. He attributes his weight management to portion control. He wants to return to regular walking but has not been consistent with exercise. He has no leg pain but needs motivation to start exercising again. His father died at 68 from a heart attack. His mother lived to 58 or 80, passing away from cancer that started in the large intestine and later involved the pancreas. He denies  chest pain, shortness of breath, lower extremity edema, fatigue, palpitations, presyncope, syncope, orthopnea, and PND. He sees Dr. Shayne, his PCP twice yearly and Dr. Shayne monitors cholesterol closely.     Past Medical History:  Diagnosis Date   Allergy    Arthritis    neck   Cataract    Gout    Hyperlipidemia    Hypertension     Past  Surgical History:  Procedure Laterality Date   APPENDECTOMY  1986   CATARACT EXTRACTION     COLONOSCOPY     POLYPECTOMY     ROTATOR CUFF REPAIR Right    march 4    SHOULDER SURGERY Right 2024   TOTAL SHOULDER ARTHROPLASTY Right 07/28/2019   Procedure: REVERSE TOTAL SHOULDER ARTHROPLASTY;  Surgeon: Dozier Soulier, MD;  Location: [facility];  Service: Orthopedics;  Laterality: Right;    Current Medications: Current Meds  Medication Sig   amLODipine (NORVASC) 5 MG tablet Take 5 mg by mouth daily.   ANDROGEL PUMP 20.25 MG/ACT (1.62%) GEL Apply 1 application topically daily.    atorvastatin (LIPITOR) 20 MG tablet Take 20 mg by mouth daily.   cholecalciferol (VITAMIN D) 1000 UNITS tablet Take 1,000 Units by mouth daily.   Cyanocobalamin (VITAMIN B 12 PO) Take 1 tablet by mouth daily.    EPINEPHrine  0.3 mg/0.3 mL IJ SOAJ injection    escitalopram (LEXAPRO) 10 MG tablet Take 10 mg by mouth daily.   fexofenadine (ALLEGRA) 180 MG tablet Take 180 mg by mouth daily.   fluticasone (CUTIVATE) 0.05 % cream Apply topically 2 (two) times daily. to affected area   losartan-hydrochlorothiazide (HYZAAR) 100-12.5 MG per tablet Take 1 tablet by mouth daily.   Multiple Vitamin (MULTIVITAMIN) tablet Take 1 tablet by mouth daily.   REPATHA SURECLICK 140 MG/ML SOAJ Inject into the skin every 14 (fourteen) days.   saw palmetto 160 MG capsule Take 160 mg by mouth daily.   tadalafil (CIALIS) 20 MG tablet Take 20 mg by mouth daily as needed.     Allergies:   Bee venom   Social History   Socioeconomic History   Marital status: Married    Spouse name: Alice    Number of children: 2   Years of education: BS   Highest education level: Not on file  Occupational History   Occupation: Retired   Occupation: Retired Field seismologist    Comment: [employer]  Tobacco Use   Smoking status: Former    Current packs/day: 0.00    Types: Cigarettes    Start date: 07/22/1967    Quit date: 07/22/1987     Years since quitting: 36.2   Smokeless tobacco: Never  Vaping Use   Vaping status: Never Used  Substance and Sexual Activity   Alcohol use: Yes    Alcohol/week: 28.0 standard drinks of alcohol    Types: 28 Glasses of wine per week    Comment: daily   Drug use: No   Sexual activity: Not on file  Other Topics Concern   Not on file  Social History Narrative   Patient lives with wife [place]. patient has 2 children. Patient has his BSPatient drinks 2-4 glasses wife dialy. Drinks caffeine daily.       Right Handed    Lives in a one story home. Has steps to the attic. Lives with Wife.   Social Drivers of Corporate investment banker Strain: Not on file  Food Insecurity: Not on file  Transportation Needs: Not on  file  Physical Activity: Not on file  Stress: Not on file  Social Connections: Not on file     Family History: The patient's family history includes Colon cancer (age of onset: 11) in his mother; Diabetes in his father; Heart Problems in his paternal grandfather; Heart disease in his father and paternal uncle. There is no history of Esophageal cancer, Rectal cancer, Stomach cancer, or Colon polyps.  ROS:   Please see the history of present illness.   All other systems reviewed and are negative.  Labs/Other Studies Reviewed:    The following studies were reviewed today:  Cardiac Studies & Procedures         Recent Labs: No results found for requested labs within last 365 days.  Recent Lipid Panel No results found for: CHOL, TRIG, HDL, CHOLHDL, VLDL, LDLCALC, LDLDIRECT   Risk Assessment/Calculations:         Physical Exam:    VS:  BP 134/64 (BP Location: Right Arm, Patient Position: Sitting, Cuff Size: Normal)   Pulse 66   Ht 5' 8 (1.727 m)   Wt 167 lb 11.2 oz (76.1 kg)   SpO2 95%   BMI 25.50 kg/m     Wt Readings from Last 3 Encounters:  10/15/23 167 lb 11.2 oz (76.1 kg)  11/25/22 172 lb 9.6 oz (78.3 kg)  11/24/22 173 lb (78.5 kg)      GEN:  Well nourished, well developed in no acute distress HEENT: Normal NECK: No JVD; No carotid bruits CARDIAC: RRR, no murmurs, rubs, gallops RESPIRATORY:  Clear to auscultation without rales, wheezing or rhonchi  ABDOMEN: Soft, non-tender, non-distended MUSCULOSKELETAL:  No edema; No deformity. 2+ pedal pulses, equal bilaterally SKIN: Warm and dry NEUROLOGIC:  Alert and oriented x 3 PSYCHIATRIC:  Normal affect   EKG:  EKG is not ordered today     Diagnoses:    1. Elevated coronary artery calcium score   2. Hyperlipidemia LDL goal <70   3. Coronary artery disease involving native coronary artery of native heart without angina pectoris   4. Incomplete right bundle branch block   5. Essential hypertension      Assessment and Plan:     CAD/elevated coronary artery calcium score: CAC 2980 (99th percentile) with heaviest distribution of calcium on LAD and RCA on CT calcium score 10/2019. He underwent cardiac PET which revealed normal LV perfusion, no evidence of ischemia or infarction, low risk study. Significant coronary calcifications were present. He is not very active presently, but plans to start exercising. He denies chest pain, dyspnea, or other symptoms concerning for angina.  No indication for further ischemic evaluation at this time. Encouraged him to gradually increase exercise to achieve 150 minutes of moderate intensity exercise each week. Continue heart healthy diet, good BP control, and good cholesterol control.   Brain fog: Improved. No further concerns, thinks it was secondary to COVID infection.   Incomplete right bundle branch block: Seen on EKG 08/04/22, not seen on EKG today.  No evidence of ischemia and normal LVEF on cardiac PET 11/12/22. No indication for further testing at this time.   Hyperlipidemia LDL goal < 70: LDL 27 on 11/04/22. Checked more recently by PCP but those results are not available. Reports it has been well-controlled as long as he has been on  Repatha and Lipitor. No change in lipid lowering therapy today.    Hypertension: BP is well controlled. No anti-hypertensive medication changes today.     Disposition: 1 year with Dr. Barbaraann  or APP  Medication Adjustments/Labs and Tests Ordered: Current medicines are reviewed at length with the patient today.  Concerns regarding medicines are outlined above.  Orders Placed This Encounter  Procedures   EKG 12-Lead   No orders of the defined types were placed in this encounter.   Patient Instructions  Medication Instructions:   Your physician recommends that you continue on your current medications as directed. Please refer to the Current Medication list given to you today.   *If you need a refill on your cardiac medications before your next appointment, please call your pharmacy*  Lab Work:  None ordered.  If you have labs (blood work) drawn today and your tests are completely normal, you will receive your results only by: MyChart Message (if you have MyChart) OR A paper copy in the mail If you have any lab test that is abnormal or we need to change your treatment, we will call you to review the results.  Testing/Procedures:  None ordered.   Follow-Up: At [facility], you and your health needs are our priority.  As part of our continuing mission to provide you with exceptional heart care, our providers are all part of one team.  This team includes your primary Cardiologist (physician) and Advanced Practice Providers or APPs (Physician Assistants and Nurse Practitioners) who all work together to provide you with the care you need, when you need it.  Your next appointment:   1 year(s)  Provider:   Darryle ONEIDA Decent, MD    We recommend signing up for the patient portal called MyChart.  Sign up information is provided on this After Visit Summary.  MyChart is used to connect with patients for Virtual Visits (Telemedicine).  Patients are able to view lab/test results,  encounter notes, upcoming appointments, etc.  Non-urgent messages can be sent to your provider as well.   To learn more about what you can do with MyChart, go to [email or web address].   Other Instructions  Your physician wants you to follow-up in: 1 year.  You will receive a reminder letter in the mail two months in advance. If you don't receive a letter, please call our office to schedule the follow-up appointment.        Signed, Percy Rosaline HERO, NP  10/16/2023 2:33 PM    [facility]

## 2023-10-15 NOTE — Patient Instructions (Signed)
 Medication Instructions:   Your physician recommends that you continue on your current medications as directed. Please refer to the Current Medication list given to you today.   *If you need a refill on your cardiac medications before your next appointment, please call your pharmacy*  Lab Work:  None ordered.  If you have labs (blood work) drawn today and your tests are completely normal, you will receive your results only by: MyChart Message (if you have MyChart) OR A paper copy in the mail If you have any lab test that is abnormal or we need to change your treatment, we will call you to review the results.  Testing/Procedures:  None ordered.   Follow-Up: At [facility], you and your health needs are our priority.  As part of our continuing mission to provide you with exceptional heart care, our providers are all part of one team.  This team includes your primary Cardiologist (physician) and Advanced Practice Providers or APPs (Physician Assistants and Nurse Practitioners) who all work together to provide you with the care you need, when you need it.  Your next appointment:   1 year(s)  Provider:   Darryle ONEIDA Decent, MD    We recommend signing up for the patient portal called MyChart.  Sign up information is provided on this After Visit Summary.  MyChart is used to connect with patients for Virtual Visits (Telemedicine).  Patients are able to view lab/test results, encounter notes, upcoming appointments, etc.  Non-urgent messages can be sent to your provider as well.   To learn more about what you can do with MyChart, go to [email or web address].   Other Instructions  Your physician wants you to follow-up in: 1 year.  You will receive a reminder letter in the mail two months in advance. If you don't receive a letter, please call our office to schedule the follow-up appointment.

## 2023-10-16 ENCOUNTER — Encounter (HOSPITAL_BASED_OUTPATIENT_CLINIC_OR_DEPARTMENT_OTHER): Payer: Self-pay | Admitting: Nurse Practitioner

## 2023-11-19 ENCOUNTER — Other Ambulatory Visit: Payer: Self-pay

## 2023-11-19 ENCOUNTER — Encounter (HOSPITAL_BASED_OUTPATIENT_CLINIC_OR_DEPARTMENT_OTHER): Payer: Self-pay

## 2023-11-19 ENCOUNTER — Emergency Department (HOSPITAL_BASED_OUTPATIENT_CLINIC_OR_DEPARTMENT_OTHER)
Admission: EM | Admit: 2023-11-19 | Discharge: 2023-11-20 | Disposition: A | Discharge status: Home / Self Care | Attending: Emergency Medicine | Admitting: Emergency Medicine

## 2023-11-19 DIAGNOSIS — Z79899 Other long term (current) drug therapy: Secondary | ICD-10-CM | POA: Diagnosis not present

## 2023-11-19 DIAGNOSIS — Z4801 Encounter for change or removal of surgical wound dressing: Secondary | ICD-10-CM | POA: Diagnosis present

## 2023-11-19 DIAGNOSIS — W540XXA Bitten by dog, initial encounter: Secondary | ICD-10-CM | POA: Diagnosis not present

## 2023-11-19 DIAGNOSIS — S61411A Laceration without foreign body of right hand, initial encounter: Secondary | ICD-10-CM | POA: Insufficient documentation

## 2023-11-19 DIAGNOSIS — I10 Essential (primary) hypertension: Secondary | ICD-10-CM | POA: Insufficient documentation

## 2023-11-19 DIAGNOSIS — S6991XA Unspecified injury of right wrist, hand and finger(s), initial encounter: Secondary | ICD-10-CM | POA: Diagnosis present

## 2023-11-19 NOTE — ED Notes (Signed)
 Wounds to right hand irrigated as instructed

## 2023-11-19 NOTE — ED Triage Notes (Signed)
 Pt presents via POV c/o dog bite to right hand. Reports his dog bit him. UTD on animal vaccinations. TDAP UTD per pt.

## 2023-11-20 ENCOUNTER — Emergency Department (HOSPITAL_BASED_OUTPATIENT_CLINIC_OR_DEPARTMENT_OTHER)
Admission: EM | Admit: 2023-11-20 | Discharge: 2023-11-20 | Disposition: A | Discharge status: Home / Self Care | Source: Home / Self Care

## 2023-11-20 DIAGNOSIS — Z4801 Encounter for change or removal of surgical wound dressing: Secondary | ICD-10-CM | POA: Insufficient documentation

## 2023-11-20 DIAGNOSIS — Z5189 Encounter for other specified aftercare: Secondary | ICD-10-CM

## 2023-11-20 MED ORDER — AMOXICILLIN-POT CLAVULANATE 875-125 MG PO TABS: 1.0000 | ORAL_TABLET | Freq: Two times a day (BID) | ORAL | 0 refills | Status: AC

## 2023-11-20 MED ORDER — AMOXICILLIN-POT CLAVULANATE 875-125 MG PO TABS
1.0000 | ORAL_TABLET | Freq: Once | ORAL | Status: AC
  Administered 2023-11-20: 1 via ORAL
  Filled 2023-11-20: qty 1

## 2023-11-20 NOTE — Discharge Instructions (Signed)
 Please continue taking antibiotic as previously prescribed.  You may continue to take over-the-counter Tylenol  as needed for pain.  Monitor for red streaking from the wound, purulent discharge from the wound, or progressive worsening pain.  If you have concern please return.

## 2023-11-20 NOTE — Discharge Instructions (Addendum)
 Do not let your laceration (cut) get wet for the next 48 hours. After that you may allow soapy water  to drain down the wound to clean it.  Please do not scrub.  Do not submerge the wound under water  for the next 2 weeks.   To minimize scarring, you can apply a vaseline based ointment for the next 2 weeks and keep it out of direct sun light. After that, you may apply sunscreen for the next several months.   Your steri-strips should peel off in 1-2 weeks.   Return if your wound appears to be infected (see laceration care instructions).   For pain control you may take 1000 mg of acetaminophen  (Tylenol ) every 8 hours and/or 600 mg of Ibuprofen (Motrin, Advil, etc.) every 6-8 hours as needed.  Please limit acetaminophen  (Tylenol ) to 4000 mg and Ibuprofen (Motrin, Advil, etc.) to 2400 mg for a 24hr period. Please note that other over-the-counter medicine may contain acetaminophen  or ibuprofen as a component of their ingredients.

## 2023-11-20 NOTE — ED Provider Notes (Signed)
 [place] EMERGENCY DEPARTMENT AT [facility] Provider Note  CSN: [id number] Arrival date & time: 11/19/23 2332  Chief Complaint(s) Animal Bite  HPI Randy Moreno is a 78 y.o. male here with right hand injury that occurred at home.  Patient reports that his dog snapped at his hand while he was trying to grab a new toy that he smothered peanut butter on.  Dog is up-to-date on vaccines.  Patient believes he is up-to-date on his tetanus vaccine.  He is not anticoagulated.  Endorses pain related to the injury.  Still able to move all fingers and has intact sensation.  The history is provided by the patient.    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 Authorizing Provider  amoxicillin -clavulanate (AUGMENTIN ) 875-125 MG tablet Take 1 tablet by mouth every 12 (twelve) hours. 11/20/23  Yes Derrius Furtick, Raynell Moder, MD  amLODipine (NORVASC) 5 MG tablet Take 5 mg by mouth daily.    [provider]  ANDROGEL PUMP 20.25 MG/ACT (1.62%) GEL Apply 1 application topically daily.  08/10/17   [provider]  atorvastatin (LIPITOR) 20 MG tablet Take 20 mg by mouth daily.    [provider]  cholecalciferol (VITAMIN D) 1000 UNITS tablet Take 1,000 Units by mouth daily.    [provider]  Cyanocobalamin (VITAMIN B 12 PO) Take 1 tablet by mouth daily.     [provider]  EPINEPHrine  0.3 mg/0.3 mL IJ SOAJ injection  04/24/14    [provider]  escitalopram (LEXAPRO) 10 MG tablet Take 10 mg by mouth daily.    [provider]  fexofenadine (ALLEGRA) 180 MG tablet Take 180 mg by mouth daily.    [provider]  fluticasone (CUTIVATE) 0.05 % cream Apply topically 2 (two) times daily. to affected area 08/26/22   [provider]  losartan-hydrochlorothiazide (HYZAAR) 100-12.5 MG per tablet Take 1 tablet by mouth daily.    [provider]  Multiple Vitamin (MULTIVITAMIN) tablet Take 1 tablet by mouth daily.    [provider]  REPATHA SURECLICK 140 MG/ML SOAJ Inject into the skin every 14 (fourteen) days. 04/06/22   [provider]  saw palmetto 160 MG capsule Take 160 mg by mouth daily.    [provider]  tadalafil (CIALIS) 20 MG tablet Take 20 mg by mouth daily as needed. 07/02/23   [provider]  Allergies Bee venom  Review of Systems Review of Systems As noted in HPI  Physical Exam Vital Signs  I have reviewed the triage vital signs BP 138/65   Pulse 64   Temp 98.1 F (36.7 C) (Oral)   Resp 16   SpO2 100%   Physical Exam Vitals reviewed.  Constitutional:      General: He is not in acute distress.    Appearance: He is well-developed. He is not diaphoretic.  HENT:     Head: Normocephalic and atraumatic.     Right Ear: External ear normal.     Left Ear: External ear normal.     Nose: Nose normal.     Mouth/Throat:     Mouth: Mucous membranes are moist.  Eyes:     General: No scleral icterus.    Conjunctiva/sclera: Conjunctivae normal.  Neck:     Trachea: Phonation normal.  Cardiovascular:     Rate and Rhythm: Normal rate and regular rhythm.  Pulmonary:     Effort: Pulmonary effort is normal. No respiratory distress.     Breath sounds: No stridor.  Abdominal:     General: There is no  distension.  Musculoskeletal:        General: Normal range of motion.     Right hand: Laceration present.       Hands:     Cervical back: Normal range of motion.  Neurological:     Mental Status: He is alert and oriented to person, place, and time.  Psychiatric:        Behavior: Behavior normal.     ED Results and Treatments Labs (all labs ordered are listed, but only abnormal results are displayed) Labs Reviewed - No data to display                                                                                                                       EKG  EKG Interpretation Date/Time:    Ventricular Rate:    PR Interval:    QRS Duration:    QT Interval:    QTC Calculation:   R Axis:      Text Interpretation:         Radiology No results found.  Medications Ordered in ED Medications  amoxicillin -clavulanate (AUGMENTIN ) 875-125 MG per tablet 1 tablet (1 tablet Oral Given 11/20/23 0058)   Procedures .Laceration Repair  Date/Time: 11/20/2023 1:06 AM  Performed by: Trine Raynell Moder, MD Authorized by: Trine Raynell Moder, MD   Consent:    Consent obtained:  Verbal   Risks discussed:  Infection, pain, poor cosmetic result and poor wound healing   Alternatives discussed:  No treatment Universal protocol:    Procedure explained and questions answered to patient or proxy's satisfaction: yes     Patient identity confirmed:  Verbally with patient Laceration details:    Location:  Hand   Hand location:  R hand, dorsum   Length (cm):  4.5   Depth (mm):  1 Exploration:  Hemostasis achieved with:  Direct pressure   Wound exploration: wound explored through full range of motion and entire depth of wound visualized     Wound extent: fascia not violated, no foreign body, no signs of injury, no nerve damage, no tendon damage and no vascular damage     Contaminated: no   Treatment:    Area cleansed with:  Saline   Amount of cleaning:  Standard   Irrigation  solution:  Sterile saline   Irrigation volume:  1000cc   Irrigation method:  Pressure wash Skin repair:    Repair method:  Steri-Strips   Number of Steri-Strips:  4 Approximation:    Approximation:  Close Repair type:    Repair type:  Simple Post-procedure details:    Dressing:  Non-adherent dressing   Procedure completion:  Tolerated   (including critical care time) Medical Decision Making / ED Course   Medical Decision Making Risk Prescription drug management.    Dog bite resulting in superficial lacerations to the dorsum of the right hand.  Wounds were thoroughly irrigated and closed as above.  He is neurovascular intact distally.  No concern for bony injury.  Patient believes he is up-to-date on tetanus vaccine.  Recommend he follow-up with his PCP to ensure.    Final Clinical Impression(s) / ED Diagnoses Final diagnoses:  Dog bite, initial encounter   The patient appears reasonably screened and/or stabilized for discharge and I doubt any other medical condition or other [facility] requiring further screening, evaluation, or treatment in the ED at this time. I have discussed the findings, Dx and Tx plan with the patient/family who expressed understanding and agree(s) with the plan. Discharge instructions discussed at length. The patient/family was given strict return precautions who verbalized understanding of the instructions. No further questions at time of discharge.  Disposition: Discharge  Condition: Good  ED Discharge Orders          Ordered    amoxicillin -clavulanate (AUGMENTIN ) 875-125 MG tablet  Every 12 hours        11/20/23 0046             Follow Up: Shayne Anes, MD [address] [place] [address] [phone]  Call  about your Tdap status     This chart was dictated using voice recognition software.  Despite best efforts to proofread,  errors can occur which can change the documentation meaning.    Trine Raynell Moder, MD 11/20/23  [phone]

## 2023-11-20 NOTE — ED Triage Notes (Signed)
 Patient states was seen here last night for a dog bit to his right hand. States when he got home he noticed another abrasion on his hand he doesn't think they looked at last night. Also wants initial wound reexamined because he thinks it is getting more swollen.

## 2023-11-20 NOTE — ED Provider Notes (Signed)
 [place] EMERGENCY DEPARTMENT AT [facility] Provider Note   CSN: [id number] Arrival date & time: 11/20/23  1329     Patient presents with: Animal Bite   Randy Moreno is a 78 y.o. male.   The history is provided by the patient and medical records. No language interpreter was used.  Animal Bite Associated symptoms: no fever      78 year old male presenting for evaluation of dog bite.  Patient was seen in the ER last night after his dog bit his hand while he was trying to grab a new toy from the dog.  The dog is up-to-date with vaccines and patient is not anticoagulated.  His wounds was cleaned and laceration repair to his right hand on the dorsum aspect.  Patient was subsequently discharged home with Augmentin .  X-ray obtained at that time which was negative for acute fracture or dislocation.  Patient returns today stating that he noticed there is another wound on the palm of his right hand that was not sutured.  He also noticed swelling to the affected area.  Pain is about the same.  No numbness no fever.  Prior to Admission medications   Medication Sig Start Date End Date Taking? Authorizing Provider  amLODipine (NORVASC) 5 MG tablet Take 5 mg by mouth daily.    [provider]  amoxicillin -clavulanate (AUGMENTIN ) 875-125 MG tablet Take 1 tablet by mouth every 12 (twelve) hours. 11/20/23   Trine Raynell Moder, MD  ANDROGEL PUMP 20.25 MG/ACT (1.62%) GEL Apply 1 application topically daily.  08/10/17   [provider]  atorvastatin (LIPITOR) 20 MG tablet Take 20 mg by mouth daily.    [provider]  cholecalciferol (VITAMIN D) 1000 UNITS tablet Take 1,000 Units by mouth daily.    [provider]  Cyanocobalamin (VITAMIN B 12 PO) Take 1 tablet by mouth daily.     [provider]  EPINEPHrine  0.3 mg/0.3 mL IJ SOAJ injection  04/24/14   [provider]  escitalopram (LEXAPRO) 10 MG tablet Take 10 mg by mouth daily.    [provider]  fexofenadine (ALLEGRA) 180 MG tablet Take 180 mg by mouth daily.    [provider]  fluticasone (CUTIVATE) 0.05 % cream Apply topically 2 (two) times daily. to affected area 08/26/22   [provider]  losartan-hydrochlorothiazide (HYZAAR) 100-12.5 MG per tablet Take 1 tablet by mouth daily.    [provider]  Multiple Vitamin (MULTIVITAMIN) tablet Take 1 tablet by mouth daily.    [provider]  REPATHA SURECLICK 140 MG/ML SOAJ Inject into the skin every 14 (fourteen) days. 04/06/22   [provider]  saw palmetto 160 MG capsule Take 160 mg by mouth daily.    [provider]  tadalafil (CIALIS) 20 MG tablet Take 20 mg by mouth daily as needed. 07/02/23   [provider]    Allergies: Bee venom    Review of Systems  Constitutional:  Negative for fever.  Skin:  Positive for wound.    Updated Vital Signs BP (!) 146/75   Pulse 84   Temp 98.3 F (36.8 C) (Oral)   Resp 16   SpO2 97%   Physical Exam Constitutional:      General: He is not in acute distress.    Appearance: He is well-developed.  HENT:     Head: Atraumatic.  Eyes:     Conjunctiva/sclera: Conjunctivae normal.  Musculoskeletal:        General: Signs of  injury (Right wrist and hand was wrapped in a Coban and Ace wrap.  I removed the Ace wrap, normal-appearing wound with some mild edema noted.  Small laceration noted to the thenar eminence of the right hand without any retained foreign body and not actively bleed) present.     Cervical back: Normal range of motion and neck supple.  Skin:    Findings: No rash.  Neurological:     Mental Status: He is alert.     (all labs ordered are listed, but only abnormal results are displayed) Labs Reviewed - No data to display  EKG: None  Radiology: No results found.   Procedures   Medications Ordered in the ED - No data to display                                  Medical Decision  Making  BP (!) 146/75   Pulse 84   Temp 98.3 F (36.8 C) (Oral)   Resp 16   SpO2 97%   35:17 PM  78 year old male presenting for evaluation of dog bite.  Patient was seen in the ER last night after his dog bit his hand while he was trying to grab a new toy from the dog.  The dog is up-to-date with vaccines and patient is not anticoagulated.  His wounds was cleaned and laceration repair to his right hand on the dorsum aspect.  Patient was subsequently discharged home with Augmentin .  X-ray obtained at that time which was negative for acute fracture or dislocation.  Patient returns today stating that he noticed there is another wound on the palm of his right hand that was not sutured.  He also noticed swelling to the affected area.  Pain is about the same.  No numbness no fever.  Examination of the hand wound without concerning feature.  No lymphangitis no purulent discharge some mild edema to be expected.  Reassurance given, wound rewrapped and encourage patient to continue with antibiotic and I gave patient return precautions such as worsening pain purulent discharge increased redness or lymphangitis.     Final diagnoses:  Visit for wound check    ED Discharge Orders     None          Nivia Colon, PA-C 11/20/23 1623    Simon Lavonia SAILOR, MD 11/21/23 [phone]
